# Patient Record
Sex: Female | Born: 1965 | Race: Black or African American | Hispanic: No | Marital: Single | Smoking: Never smoker
Health system: Southern US, Community
[De-identification: ages and names within clinical notes are randomized; demographics above are authoritative.]

## PROBLEM LIST (undated history)

## (undated) ENCOUNTER — Emergency Department (HOSPITAL_COMMUNITY): Admission: EM | Payer: Medicare HMO | Source: Home / Self Care

## (undated) DIAGNOSIS — L738 Other specified follicular disorders: Secondary | ICD-10-CM

## (undated) DIAGNOSIS — F419 Anxiety disorder, unspecified: Secondary | ICD-10-CM

## (undated) DIAGNOSIS — M069 Rheumatoid arthritis, unspecified: Secondary | ICD-10-CM

## (undated) DIAGNOSIS — I10 Essential (primary) hypertension: Secondary | ICD-10-CM

## (undated) HISTORY — PX: VAGINA RECONSTRUCTION SURGERY: SHX828

## (undated) HISTORY — PX: COLON SURGERY: SHX602

## (undated) HISTORY — PX: STOMACH SURGERY: SHX791

## (undated) HISTORY — PX: ABDOMINAL HYSTERECTOMY: SHX81

---

## 2008-06-23 ENCOUNTER — Emergency Department (HOSPITAL_BASED_OUTPATIENT_CLINIC_OR_DEPARTMENT_OTHER): Admission: EM | Admit: 2008-06-23 | Discharge: 2008-06-23 | Payer: Self-pay | Admitting: Emergency Medicine

## 2008-07-05 ENCOUNTER — Emergency Department (HOSPITAL_BASED_OUTPATIENT_CLINIC_OR_DEPARTMENT_OTHER): Admission: EM | Admit: 2008-07-05 | Discharge: 2008-07-05 | Payer: Self-pay | Admitting: Emergency Medicine

## 2008-07-21 ENCOUNTER — Emergency Department (HOSPITAL_BASED_OUTPATIENT_CLINIC_OR_DEPARTMENT_OTHER): Admission: EM | Admit: 2008-07-21 | Discharge: 2008-07-21 | Payer: Self-pay | Admitting: Emergency Medicine

## 2013-05-26 DIAGNOSIS — K219 Gastro-esophageal reflux disease without esophagitis: Secondary | ICD-10-CM | POA: Insufficient documentation

## 2014-02-01 DIAGNOSIS — M51379 Other intervertebral disc degeneration, lumbosacral region without mention of lumbar back pain or lower extremity pain: Secondary | ICD-10-CM | POA: Insufficient documentation

## 2014-02-01 DIAGNOSIS — M5137 Other intervertebral disc degeneration, lumbosacral region: Secondary | ICD-10-CM | POA: Insufficient documentation

## 2014-03-18 DIAGNOSIS — F3162 Bipolar disorder, current episode mixed, moderate: Secondary | ICD-10-CM | POA: Insufficient documentation

## 2014-03-18 DIAGNOSIS — F4541 Pain disorder exclusively related to psychological factors: Secondary | ICD-10-CM | POA: Insufficient documentation

## 2014-06-05 DIAGNOSIS — I10 Essential (primary) hypertension: Secondary | ICD-10-CM | POA: Insufficient documentation

## 2014-06-05 DIAGNOSIS — G4730 Sleep apnea, unspecified: Secondary | ICD-10-CM | POA: Insufficient documentation

## 2014-06-05 DIAGNOSIS — Z9889 Other specified postprocedural states: Secondary | ICD-10-CM | POA: Insufficient documentation

## 2014-06-05 DIAGNOSIS — Z6841 Body Mass Index (BMI) 40.0 and over, adult: Secondary | ICD-10-CM | POA: Insufficient documentation

## 2014-06-05 DIAGNOSIS — F419 Anxiety disorder, unspecified: Secondary | ICD-10-CM | POA: Insufficient documentation

## 2014-07-24 DIAGNOSIS — E1159 Type 2 diabetes mellitus with other circulatory complications: Secondary | ICD-10-CM

## 2014-07-24 DIAGNOSIS — E669 Obesity, unspecified: Secondary | ICD-10-CM | POA: Insufficient documentation

## 2014-07-24 DIAGNOSIS — E119 Type 2 diabetes mellitus without complications: Secondary | ICD-10-CM | POA: Insufficient documentation

## 2014-07-24 DIAGNOSIS — I10 Essential (primary) hypertension: Secondary | ICD-10-CM

## 2014-07-24 DIAGNOSIS — E1169 Type 2 diabetes mellitus with other specified complication: Secondary | ICD-10-CM

## 2014-07-26 DIAGNOSIS — E785 Hyperlipidemia, unspecified: Secondary | ICD-10-CM | POA: Insufficient documentation

## 2014-07-26 DIAGNOSIS — E161 Other hypoglycemia: Secondary | ICD-10-CM | POA: Insufficient documentation

## 2014-11-21 DIAGNOSIS — Z90710 Acquired absence of both cervix and uterus: Secondary | ICD-10-CM | POA: Insufficient documentation

## 2014-12-12 ENCOUNTER — Emergency Department (HOSPITAL_BASED_OUTPATIENT_CLINIC_OR_DEPARTMENT_OTHER): Payer: Medicare HMO

## 2014-12-12 ENCOUNTER — Encounter (HOSPITAL_BASED_OUTPATIENT_CLINIC_OR_DEPARTMENT_OTHER): Payer: Self-pay

## 2014-12-12 ENCOUNTER — Emergency Department (HOSPITAL_BASED_OUTPATIENT_CLINIC_OR_DEPARTMENT_OTHER)
Admission: EM | Admit: 2014-12-12 | Discharge: 2014-12-12 | Disposition: A | Discharge status: Home / Self Care | Payer: Medicare HMO | Attending: Emergency Medicine | Admitting: Emergency Medicine

## 2014-12-12 DIAGNOSIS — F419 Anxiety disorder, unspecified: Secondary | ICD-10-CM | POA: Diagnosis not present

## 2014-12-12 DIAGNOSIS — Z79899 Other long term (current) drug therapy: Secondary | ICD-10-CM | POA: Diagnosis not present

## 2014-12-12 DIAGNOSIS — M79671 Pain in right foot: Secondary | ICD-10-CM | POA: Diagnosis present

## 2014-12-12 DIAGNOSIS — M722 Plantar fascial fibromatosis: Secondary | ICD-10-CM

## 2014-12-12 DIAGNOSIS — I10 Essential (primary) hypertension: Secondary | ICD-10-CM | POA: Insufficient documentation

## 2014-12-12 DIAGNOSIS — R52 Pain, unspecified: Secondary | ICD-10-CM

## 2014-12-12 HISTORY — DX: Anxiety disorder, unspecified: F41.9

## 2014-12-12 HISTORY — DX: Essential (primary) hypertension: I10

## 2014-12-12 NOTE — ED Provider Notes (Signed)
CSN: [id number]     Arrival date & time 12/12/14  1417 History   First MD Initiated Contact with Patient 12/12/14 1526     Chief Complaint  Patient presents with  . Foot Pain     (Consider location/radiation/quality/duration/timing/severity/associated sxs/prior Treatment) HPI Comments: 48 year old morbidly obese female presenting with right foot pain 2 weeks. No known injury or trauma. She reports shooting pain across the bottom of her foot when she takes a step. Denies any swelling. No fevers or skin color change. She has tried Tylenol with minimal relief. Denies any history of gout. She has also complaining of mild left big toe pain. No injury or trauma. No swelling. No aggravating or alleviating factors.  Patient is a 48 y.o. female presenting with lower extremity pain. The history is provided by the patient.  Foot Pain    Past Medical History  Diagnosis Date  . Hypertension   . Anxiety    Past Surgical History  Procedure Laterality Date  . Abdominal hysterectomy    . Vagina reconstruction surgery     No family history on file. History  Substance Use Topics  . Smoking status: Never Smoker   . Smokeless tobacco: Not on file  . Alcohol Use: No   OB History    No data available     Review of Systems  10 Systems reviewed and are negative for acute change except as noted in the HPI.  Allergies  Review of patient's allergies indicates no known allergies.  Home Medications   Prior to Admission medications   Medication Sig Start Date End Date Taking? Authorizing Provider  ALPRAZolam (XANAX PO) Take by mouth.   Yes Historical Provider, MD  CARVEDILOL PO Take by mouth.   Yes Historical Provider, MD  VALSARTAN PO Take by mouth.   Yes Historical Provider, MD   BP 142/91 mmHg  Pulse 91  Temp(Src) 98.3 F (36.8 C) (Oral)  Resp 18  Ht 5\' 9"  (1.753 m)  Wt 393 lb (178.264 kg)  BMI 58.01 kg/m2  SpO2 100% Physical Exam  Constitutional: She is oriented to person,  place, and time. She appears well-developed and well-nourished. No distress.  Morbidly obese.  HENT:  Head: Normocephalic and atraumatic.  Mouth/Throat: Oropharynx is clear and moist.  Eyes: Conjunctivae and EOM are normal.  Neck: Normal range of motion. Neck supple.  Cardiovascular: Normal rate, regular rhythm and normal heart sounds.   Pulmonary/Chest: Effort normal and breath sounds normal. No respiratory distress.  Musculoskeletal: Normal range of motion. She exhibits no edema.  Right foot tender to palpation from base of calcaneus along plantar fascia. No tenderness to the dorsum of her foot. No swelling. Full range of motion. Left foot mild tenderness over first MTP. No swelling, erythema or warmth. No deformity.  Neurological: She is alert and oriented to person, place, and time. No sensory deficit.  Skin: Skin is warm and dry.  Psychiatric: She has a normal mood and affect. Her behavior is normal.  Nursing note and vitals reviewed.   ED Course  Procedures (including critical care time) Labs Review Labs Reviewed - No data to display  Imaging Review Dg Foot Complete Right  12/12/2014   CLINICAL DATA:  Right heel pain for 1 month and left first toe pain 2 weeks. No injury.  EXAM: RIGHT FOOT COMPLETE - 3+ VIEW  COMPARISON:  None.  FINDINGS: Examination demonstrates no evidence of acute fracture or dislocation. Likely chronic fragmentation along the lateral base of the first proximal phalanx.  Mild degenerative change over the midfoot. Small inferior calcaneal spur.  IMPRESSION: No acute findings.   Electronically Signed   By: Marin Olp M.D.   On: 12/12/2014 15:08   Dg Toe Great Left  12/12/2014   CLINICAL DATA:  RIGHT heel pain for 1 month, LEFT great toe pain for 2 weeks, no known injury  EXAM: LEFT GREAT TOE  COMPARISON:  None  FINDINGS: Osseous mineralization normal.  Joint spaces preserved.  No acute fracture, dislocation, or bone destruction.  IMPRESSION: No acute osseous  abnormalities.   Electronically Signed   By: Lavonia Dana M.D.   On: 12/12/2014 15:07     EKG Interpretation None      MDM   Final diagnoses:  Plantar fasciitis, right   Patient in no apparent distress. No swelling, bruising, erythema or warmth. Doubt cellulitis. Doubt gout. Tenderness along plantar fascia. X-rays obtained in triage prior to my evaluation. No acute findings. Discussed symptomatic treatment for plantar fasciitis and also went over certain exercises that she could do. Follow-up with PCP. Stable for discharge. Return precautions given. Patient states understanding of treatment care plan and is agreeable.  Carman Ching, PA-C 12/12/14 [place], MD 12/12/14 [phone]

## 2014-12-12 NOTE — ED Notes (Signed)
Right foot pain x 1 month-left great toe x 2 weeks-denies injury

## 2014-12-12 NOTE — Discharge Instructions (Signed)
Plantar Fasciitis  Plantar fasciitis is a common condition that causes foot pain. It is soreness (inflammation) of the band of tough fibrous tissue on the bottom of the foot that runs from the heel bone (calcaneus) to the ball of the foot. The cause of this soreness may be from excessive standing, poor fitting shoes, running on hard surfaces, being overweight, having an abnormal walk, or overuse (this is common in runners) of the painful foot or feet. It is also common in aerobic exercise dancers and ballet dancers.  SYMPTOMS   Most people with plantar fasciitis complain of:   Severe pain in the morning on the bottom of their foot especially when taking the first steps out of bed. This pain recedes after a few minutes of walking.   Severe pain is experienced also during walking following a long period of inactivity.   Pain is worse when walking barefoot or up stairs  DIAGNOSIS    Your caregiver will diagnose this condition by examining and feeling your foot.   Special tests such as X-rays of your foot, are usually not needed.  PREVENTION    Consult a sports medicine professional before beginning a new exercise program.   Walking programs offer a good workout. With walking there is a lower chance of overuse injuries common to runners. There is less impact and less jarring of the joints.   Begin all new exercise programs slowly. If problems or pain develop, decrease the amount of time or distance until you are at a comfortable level.   Wear good shoes and replace them regularly.   Stretch your foot and the heel cords at the back of the ankle (Achilles tendon) both before and after exercise.   Run or exercise on even surfaces that are not hard. For example, asphalt is better than pavement.   Do not run barefoot on hard surfaces.   If using a treadmill, vary the incline.   Do not continue to workout if you have foot or joint problems. Seek professional help if they do not improve.  HOME CARE INSTRUCTIONS     Avoid activities that cause you pain until you recover.   Use ice or cold packs on the problem or painful areas after working out.   Only take over-the-counter or prescription medicines for pain, discomfort, or fever as directed by your caregiver.   Soft shoe inserts or athletic shoes with air or gel sole cushions may be helpful.   If problems continue or become more severe, consult a sports medicine caregiver or your own health care provider. Cortisone is a potent anti-inflammatory medication that may be injected into the painful area. You can discuss this treatment with your caregiver.  MAKE SURE YOU:    Understand these instructions.   Will watch your condition.   Will get help right away if you are not doing well or get worse.  Document Released: 08/31/2001 Document Revised: 02/28/2012 Document Reviewed: 10/30/2008  Exit[employer] Patient Information 2015 [employer]. This information is not intended to replace advice given to you by your health care provider. Make sure you discuss any questions you have with your health care provider.

## 2014-12-18 ENCOUNTER — Ambulatory Visit: Payer: Self-pay | Admitting: Podiatry

## 2014-12-18 ENCOUNTER — Encounter: Payer: Self-pay | Admitting: Podiatry

## 2014-12-18 ENCOUNTER — Ambulatory Visit (INDEPENDENT_AMBULATORY_CARE_PROVIDER_SITE_OTHER): Payer: Commercial Managed Care - HMO | Admitting: Podiatry

## 2014-12-18 VITALS — BP 142/92 | HR 85 | Ht 69.0 in | Wt 380.0 lb

## 2014-12-18 DIAGNOSIS — M7740 Metatarsalgia, unspecified foot: Secondary | ICD-10-CM | POA: Diagnosis not present

## 2014-12-18 DIAGNOSIS — M21969 Unspecified acquired deformity of unspecified lower leg: Secondary | ICD-10-CM

## 2014-12-18 DIAGNOSIS — M216X9 Other acquired deformities of unspecified foot: Secondary | ICD-10-CM | POA: Diagnosis not present

## 2014-12-18 DIAGNOSIS — M7741 Metatarsalgia, right foot: Secondary | ICD-10-CM

## 2014-12-18 DIAGNOSIS — M7742 Metatarsalgia, left foot: Principal | ICD-10-CM

## 2014-12-18 MED ORDER — NABUMETONE 500 MG PO TABS: 500.0000 mg | ORAL_TABLET | Freq: Two times a day (BID) | ORAL | Status: DC

## 2014-12-18 MED ORDER — HYDROCODONE-IBUPROFEN 7.5-200 MG PO TABS: 1.0000 | ORAL_TABLET | Freq: Four times a day (QID) | ORAL | Status: DC | PRN

## 2014-12-18 NOTE — Progress Notes (Signed)
Subjective: Pain in right heel all around the foot, left the same with 5th toe stay numb. Sore even not walking. This has been going on for about 8-9 months. Prior to be out on disability she used to work on [employer]. 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   Review of Systems - General ROS: negative for - chills, fatigue, fever, hot flashes, night sweats or sleep disturbance Ophthalmic ROS: Cornea transplant 15 years ago. ENT ROS: negative Allergy and Immunology ROS: negative Hematological and Lymphatic ROS: negative Endocrine ROS: negative Breast ROS: negative for breast lumps Respiratory ROS: no cough, shortness of breath, or wheezing Cardiovascular ROS: no chest pain or dyspnea on exertion Gastrointestinal ROS: no abdominal pain, change in bowel habits, or black or bloody stools Genito-Urinary ROS: no dysuria, trouble voiding, or hematuria Musculoskeletal ROS: Back and foot pain. Neurological ROS: negative Dermatological ROS: negative.  Objective: Dermatologic: Normal findings and no abnormal skin lesions. Vascular: All pedal pulses are palpable. No edema or erythema noted. Neurologic: All epicritic and tactile sensations grossly intact.  Orthopedic: Tight Achilles tendon bilateral. Elevated first ray bilateral. Severe ankle and Subtalar joint pronation upon loading of forefoot bilateral. Lesser metatarso-phalangeal joint pain with prolonged weight bearing.  Plantar heel pain later part of the day.  Radiographic examination reveal severe elevation of the first ray on lateral view bilateral. Positive of abnormal periosteal bone reaction along the 2nd, 3rd, 4th metatrasal shaft bilateral.   Assessment: Ankle equinus bilateral. Hypermobile first ray bilateral. Ankle and STJ hyperpronation bilateral. Plantar fasciitis bilateral. Lesser metatarsalgia bilateral. Flexible flat foot.  Abnormal weight shifting to lateral column.   Plan: Reviewed findings and  available options; Weight control, exercise to stretch Achilles tendon, Custom or OTC Orthotics, injections, change in shoe gear, ankle brace, including surgical options.  Ankle brace dispensed for both feet with instruction. Pain medication prescribed. Return in 2 weeks.

## 2014-12-18 NOTE — Patient Instructions (Signed)
Seen for bilateral foot pain. Ankle brace dispensed. Pain medication prescribed. Do stretch exercise as instructed. Return in 2 weeks.

## 2014-12-27 ENCOUNTER — Telehealth: Payer: Self-pay | Admitting: *Deleted

## 2014-12-27 NOTE — Telephone Encounter (Signed)
12/27/2013 Patient called this afternoon and request stronger pain meds and muscle relaxer . Foot is hurting worse and having cramps. Please advise, Does she need to come in this afternoon.

## 2015-01-01 ENCOUNTER — Ambulatory Visit: Payer: Commercial Managed Care - HMO | Admitting: Podiatry

## 2015-06-05 ENCOUNTER — Ambulatory Visit (HOSPITAL_COMMUNITY): Payer: Commercial Managed Care - HMO | Admitting: Psychiatry

## 2015-12-17 ENCOUNTER — Emergency Department (HOSPITAL_BASED_OUTPATIENT_CLINIC_OR_DEPARTMENT_OTHER): Payer: Medicare Other

## 2015-12-17 ENCOUNTER — Emergency Department (HOSPITAL_BASED_OUTPATIENT_CLINIC_OR_DEPARTMENT_OTHER)
Admission: EM | Admit: 2015-12-17 | Discharge: 2015-12-17 | Disposition: A | Discharge status: Home / Self Care | Payer: Medicare Other | Attending: Emergency Medicine | Admitting: Emergency Medicine

## 2015-12-17 ENCOUNTER — Encounter (HOSPITAL_BASED_OUTPATIENT_CLINIC_OR_DEPARTMENT_OTHER): Payer: Self-pay | Admitting: *Deleted

## 2015-12-17 DIAGNOSIS — F419 Anxiety disorder, unspecified: Secondary | ICD-10-CM | POA: Diagnosis not present

## 2015-12-17 DIAGNOSIS — M1712 Unilateral primary osteoarthritis, left knee: Secondary | ICD-10-CM | POA: Diagnosis not present

## 2015-12-17 DIAGNOSIS — M25562 Pain in left knee: Secondary | ICD-10-CM | POA: Diagnosis present

## 2015-12-17 DIAGNOSIS — I10 Essential (primary) hypertension: Secondary | ICD-10-CM | POA: Diagnosis not present

## 2015-12-17 MED ORDER — IBUPROFEN 800 MG PO TABS
800.0000 mg | ORAL_TABLET | Freq: Once | ORAL | Status: AC
  Administered 2015-12-17: 800 mg via ORAL
  Filled 2015-12-17: qty 1

## 2015-12-17 MED ORDER — NAPROXEN 500 MG PO TABS: 500.0000 mg | ORAL_TABLET | Freq: Two times a day (BID) | ORAL | Status: DC

## 2015-12-17 NOTE — ED Provider Notes (Signed)
CSN: [id number]     Arrival date & time 12/17/15  [id number] History   First MD Initiated Contact with Patient 12/17/15 [phone]     No chief complaint on file.    (Consider location/radiation/quality/duration/timing/severity/associated sxs/prior Treatment) HPI  Pt presenting with c/o pain behind left knee.  She states pain began approx 5 days ago, she noted pain behind her knee with some swelling.  Pain has gotten worse and now it is difficult for her to bear weight.  No foot swelling.  No trauma or injury.  She states pain is worse when she flexes her foot- she feels a pulling in her calf and behind the knee.  No chest pain or shortness of breath.  She has not had any treatment prior to arrival.  There are no other associated systemic symptoms, there are no other alleviating or modifying factors.   Past Medical History  Diagnosis Date  . Hypertension   . Anxiety    Past Surgical History  Procedure Laterality Date  . Abdominal hysterectomy    . Vagina reconstruction surgery     No family history on file. Social History  Substance Use Topics  . Smoking status: Never Smoker   . Smokeless tobacco: Never Used  . Alcohol Use: No   OB History    No data available     Review of Systems  ROS reviewed and all otherwise negative except for mentioned in HPI    Allergies  Review of patient's allergies indicates no known allergies.  Home Medications   Prior to Admission medications   Medication Sig Start Date End Date Taking? Authorizing Provider  ALPRAZolam (XANAX PO) Take by mouth.   Yes Historical Provider, MD  CARVEDILOL PO Take 6.25 mg by mouth.    Yes Historical Provider, MD  VALSARTAN PO Take by mouth.   Yes Historical Provider, MD  naproxen (NAPROSYN) 500 MG tablet Take 1 tablet (500 mg total) by mouth 2 (two) times daily. 12/17/15   Alfonzo Beers, MD   BP 126/89 mmHg  Pulse 74  Temp(Src) 97.7 F (36.5 C) (Oral)  Resp 16  Ht 5\' 9"  (1.753 m)  Wt 166.47 kg  BMI 54.17 kg/m2   SpO2 100%  Vitals reviewed Physical Exam  Physical Examination: General appearance - alert, well appearing, and in no distress Mental status - alert, oriented to person, place, and time Eyes -no conjunctival injection no scleral icterus Mouth - mucous membranes moist, pharynx normal without lesions Chest - clear to auscultation, no wheezes, rales or rhonchi, symmetric air entry Heart - normal rate, regular rhythm, normal S1, S2, no murmurs, rubs, clicks or gallops Neurological - alert, oriented, normal speech Musculoskeletal - ttp over posterior of left knee, no palpable cords, negative anterior drawer sign, pain with dorsiflexion of foot,  otherwise no joint tenderness, deformity or swelling Extremities - peripheral pulses normal, no pedal edema, no clubbing or cyanosis Skin - normal coloration and turgor, no rashes  ED Course  Procedures (including critical care time) Labs Review Labs Reviewed - No data to display  Imaging Review US Venous Img Lower Unilateral Left  12/17/2015  CLINICAL DATA:  LEFT knee pain since 12/12/2015. EXAM: LEFT LOWER EXTREMITY VENOUS DOPPLER ULTRASOUND TECHNIQUE: Gray-scale sonography with graded compression, as well as color Doppler and duplex ultrasound were performed to evaluate the lower extremity deep venous systems from the level of the common femoral vein and including the common femoral, femoral, profunda femoral, popliteal and calf veins including the posterior tibial, peroneal and  gastrocnemius veins when visible. The superficial great saphenous vein was also interrogated. Spectral Doppler was utilized to evaluate flow at rest and with distal augmentation maneuvers in the common femoral, femoral and popliteal veins. COMPARISON:  None. FINDINGS: Contralateral Common Femoral Vein: Respiratory phasicity is normal and symmetric with the symptomatic side. No evidence of thrombus. Normal compressibility. Common Femoral Vein: No evidence of thrombus. Normal  compressibility, respiratory phasicity and response to augmentation. Saphenofemoral Junction: No evidence of thrombus. Normal compressibility and flow on color Doppler imaging. Profunda Femoral Vein: No evidence of thrombus. Normal compressibility and flow on color Doppler imaging. Femoral Vein: No evidence of thrombus. Normal compressibility, respiratory phasicity and response to augmentation. Popliteal Vein: No evidence of thrombus. Normal compressibility, respiratory phasicity and response to augmentation. Calf Veins: No evidence of thrombus. Normal compressibility and flow on color Doppler imaging. Superficial Great Saphenous Vein: No evidence of thrombus. Normal compressibility and flow on color Doppler imaging. Venous Reflux:  None. Other Findings:  None. IMPRESSION: No evidence of deep venous thrombosis. Electronically Signed   By: Staci Righter M.D.   On: 12/17/2015 09:56   Dg Knee Complete 4 Views Left  12/17/2015  CLINICAL DATA:  Pain for 5 days.  No history of trauma EXAM: LEFT KNEE - COMPLETE 4+ VIEW COMPARISON:  None. FINDINGS: Frontal, lateral, and bilateral oblique views were obtained. There is soft tissue swelling. No appreciable joint effusion. No fracture or dislocation. There is narrowing of the patellofemoral joint. Other bone spaces appear normal. There is spurring in all compartments, most pronounced along the superior posterior patella. No erosive change. IMPRESSION: Osteoarthritic change, primarily in the patellofemoral joint. There is soft tissue swelling with no appreciable joint effusion. No fracture. Electronically Signed   By: Lowella Grip III M.D.   On: 12/17/2015 10:17   I have personally reviewed and evaluated these images and lab results as part of my medical decision-making.   EKG Interpretation None      MDM   Final diagnoses:  Primary osteoarthritis of left knee    Pt presenting with c/o left knee pain, pain is behind left knee and worse with dorsiflexion  of foot.  DVT study negative for DVT.  Xray of knee shows osteoarthritis- pt placed in knee immobilzer, advised f/u with orthopedics.  Pt given rx for naproxen for symptoms.  Discharged with strict return precautions.  Pt agreeable with plan.    Alfonzo Beers, MD 12/18/15 [phone]

## 2015-12-17 NOTE — ED Notes (Addendum)
C/o pain behind left knee started 12/23. C/o swelling to left knee. 2+ pedal pulses. No injury.

## 2015-12-17 NOTE — Discharge Instructions (Signed)
Return to the ED with any concerns including increased pain or swelling, fever/chills, decreased level of alertness/lethargy, or any other alarming symptoms

## 2015-12-17 NOTE — ED Notes (Signed)
Patient preparing for discharge. 

## 2016-01-21 ENCOUNTER — Encounter: Payer: Self-pay | Admitting: Family Medicine

## 2016-01-21 ENCOUNTER — Ambulatory Visit (INDEPENDENT_AMBULATORY_CARE_PROVIDER_SITE_OTHER): Payer: Medicare Other | Admitting: Family Medicine

## 2016-01-21 VITALS — BP 133/83 | HR 88 | Ht 70.0 in | Wt 367.0 lb

## 2016-01-21 DIAGNOSIS — M25562 Pain in left knee: Secondary | ICD-10-CM

## 2016-01-21 MED ORDER — TIZANIDINE HCL 4 MG PO TABS: 4.0000 mg | ORAL_TABLET | Freq: Four times a day (QID) | ORAL | Status: DC | PRN

## 2016-01-21 MED ORDER — METHYLPREDNISOLONE ACETATE 40 MG/ML IJ SUSP
40.0000 mg | Freq: Once | INTRAMUSCULAR | Status: AC
  Administered 2016-01-21: 40 mg via INTRA_ARTICULAR

## 2016-01-21 MED ORDER — DICLOFENAC SODIUM 1 % TD GEL: 4.0000 g | Freq: Four times a day (QID) | TRANSDERMAL | Status: DC

## 2016-01-21 NOTE — Patient Instructions (Signed)
Your knee pain is due to arthritis Take tylenol 500mg  1-2 tabs three times a day for pain. Voltaren gel up to 4 times a day for pain and inflammation. Glucosamine sulfate 750mg  twice a day is a supplement that may help. Other topicals in addition to the voltaren can help: Capsaicin, aspercreme, or biofreeze. Cortisone injections are an option - you were given this today. If cortisone injections do not help, there are different types of shots that may help but they take longer to take effect. It's important that you continue to stay active. Straight leg raises, knee extensions 3 sets of 10 once a day (add ankle weight if these become too easy). Consider physical therapy to strengthen muscles around the joint that hurts to take pressure off of the joint itself. Shoe inserts with good arch support may be helpful. Walker or cane if needed. Heat or ice 15 minutes at a time 3-4 times a day as needed to help with pain. Water aerobics and cycling with low resistance are the best two types of exercise for arthritis. Follow up with me in 1 month for reevaluation.

## 2016-01-26 DIAGNOSIS — M25562 Pain in left knee: Secondary | ICD-10-CM | POA: Insufficient documentation

## 2016-01-26 NOTE — Assessment & Plan Note (Signed)
Independently reviewed radiographs - consistent with DJD.  Given intraarticular injection today.  Tylenol, voltaren gel.  Discussed topical medications, glucosamine.  Shown home exercises to do daily.  F/u in 1 month for reevaluation.  After informed written consent, patient was seated on exam table. Left knee was prepped with alcohol swab and utilizing superolateral approach with ultrasound guidance, patient's left knee was injected intraarticularly with 3:1 marcaine: depomedrol. Patient tolerated the procedure well without immediate complications.

## 2016-01-26 NOTE — Progress Notes (Signed)
PCP: Benito Mccreedy, MD  Subjective:   HPI: Patient is a 50 y.o. female here for left knee pain.  Patient reports for 4 months she's had posterior to anterior left knee pain. No known injury or trauma. Worse with bearing weight, trying to straighten her knee. Pain level 9/10, sharp. Some swelling and catching. Had doppler negative for DVT. No skin changes, fever, other complaints.  Past Medical History  Diagnosis Date  . Hypertension   . Anxiety     Current Outpatient Prescriptions on File Prior to Visit  Medication Sig Dispense Refill  . naproxen (NAPROSYN) 500 MG tablet Take 1 tablet (500 mg total) by mouth 2 (two) times daily. 30 tablet 0   No current facility-administered medications on file prior to visit.    Past Surgical History  Procedure Laterality Date  . Abdominal hysterectomy    . Vagina reconstruction surgery      Allergies  Allergen Reactions  . Amlodipine     Other reaction(s): SWELLING  . Gabapentin     Other reaction(s): NAUSEA  . Pramipexole     Other reaction(s): NAUSEA  . Pregabalin     Other reaction(s): VOMITING  . Ropinirole     Other reaction(s): VOMITING    Social History   Social History  . Marital Status: Single    Spouse Name: N/A  . Number of Children: N/A  . Years of Education: N/A   Occupational History  . Not on file.   Social History Main Topics  . Smoking status: Never Smoker   . Smokeless tobacco: Never Used  . Alcohol Use: No  . Drug Use: No  . Sexual Activity: Not on file   Other Topics Concern  . Not on file   Social History Narrative    No family history on file.  BP 133/83 mmHg  Pulse 88  Ht 5\' 10"  (1.778 m)  Wt 367 lb (166.47 kg)  BMI 52.66 kg/m2  Review of Systems: See HPI above.    Objective:  Physical Exam:  Gen: NAD, comfortable in exam room.  Left knee: No gross deformity, ecchymoses.  Mild effusion. TTP medial and lateral joint lines, post patellar facets.  Mild popliteal  tenderness.  No other tenderness. FROM. Negative ant/post drawers. Negative valgus/varus testing. Negative lachmanns. Negative mcmurrays, apleys, patellar apprehension. NV intact distally.  Right knee: FROM without pain.    Assessment & Plan:  1. Left knee pain - Independently reviewed radiographs - consistent with DJD.  Given intraarticular injection today.  Tylenol, voltaren gel.  Discussed topical medications, glucosamine.  Shown home exercises to do daily.  F/u in 1 month for reevaluation.  After informed written consent, patient was seated on exam table. Left knee was prepped with alcohol swab and utilizing superolateral approach with ultrasound guidance, patient's left knee was injected intraarticularly with 3:1 marcaine: depomedrol. Patient tolerated the procedure well without immediate complications.

## 2016-02-18 ENCOUNTER — Ambulatory Visit: Payer: Medicare Other | Admitting: Family Medicine

## 2016-03-01 ENCOUNTER — Telehealth: Payer: Self-pay | Admitting: Family Medicine

## 2016-03-08 ENCOUNTER — Other Ambulatory Visit: Payer: Self-pay | Admitting: Family Medicine

## 2016-03-08 NOTE — Telephone Encounter (Signed)
Scheduled to come in 3/24 to start these.

## 2016-03-12 ENCOUNTER — Ambulatory Visit (INDEPENDENT_AMBULATORY_CARE_PROVIDER_SITE_OTHER): Payer: Medicare Other | Admitting: Family Medicine

## 2016-03-12 ENCOUNTER — Encounter: Payer: Self-pay | Admitting: Family Medicine

## 2016-03-12 VITALS — BP 107/79 | HR 75 | Ht 69.0 in | Wt 340.0 lb

## 2016-03-12 DIAGNOSIS — M25562 Pain in left knee: Secondary | ICD-10-CM

## 2016-03-12 DIAGNOSIS — M1712 Unilateral primary osteoarthritis, left knee: Secondary | ICD-10-CM

## 2016-03-12 DIAGNOSIS — M179 Osteoarthritis of knee, unspecified: Secondary | ICD-10-CM | POA: Insufficient documentation

## 2016-03-12 DIAGNOSIS — M1710 Unilateral primary osteoarthritis, unspecified knee: Secondary | ICD-10-CM | POA: Insufficient documentation

## 2016-03-12 DIAGNOSIS — M129 Arthropathy, unspecified: Secondary | ICD-10-CM | POA: Diagnosis not present

## 2016-03-12 MED ORDER — SODIUM HYALURONATE (VISCOSUP) 25 MG/2.5ML IX SOSY
2.5000 mL | PREFILLED_SYRINGE | Freq: Once | INTRA_ARTICULAR | Status: AC
  Administered 2016-03-12: 2.5 mL via INTRA_ARTICULAR

## 2016-03-12 NOTE — Assessment & Plan Note (Signed)
Independently reviewed radiographs - consistent with DJD.  Not improving with cortisone injection - starting supartz series today.  F/u in 1 week for second injection.  After informed written consent, patient was seated on exam table. Left knee was prepped with alcohol swab and utilizing superolateral approach with ultrasound guidance, patient's left knee was injected intraarticularly with 63mL marcaine followed by Jacklyn Shell. Patient tolerated the procedure well without immediate complications.

## 2016-03-12 NOTE — Progress Notes (Signed)
PCP: Benito Mccreedy, MD  Subjective:   HPI: Patient is a 50 y.o. female here for left knee pain.  2/1: Patient reports for 4 months she's had posterior to anterior left knee pain. No known injury or trauma. Worse with bearing weight, trying to straighten her knee. Pain level 9/10, sharp. Some swelling and catching. Had doppler negative for DVT. No skin changes, fever, other complaints.  3/24: Patient returns to start supartz series.  Past Medical History  Diagnosis Date  . Hypertension   . Anxiety     Current Outpatient Prescriptions on File Prior to Visit  Medication Sig Dispense Refill  . alprazolam (XANAX) 2 MG tablet TK 1 T PO  QID  1  . carvedilol (COREG) 6.25 MG tablet TK 1 T PO  BID  3  . DEXILANT 60 MG capsule TK ONE C PO BID  11  . diclofenac sodium (VOLTAREN) 1 % GEL Apply 4 g topically 4 (four) times daily. 5 Tube 1  . naproxen (NAPROSYN) 500 MG tablet Take 1 tablet (500 mg total) by mouth 2 (two) times daily. 30 tablet 0  . tiZANidine (ZANAFLEX) 4 MG tablet Take 1 tablet (4 mg total) by mouth every 6 (six) hours as needed for muscle spasms. 60 tablet 1  . valsartan-hydrochlorothiazide (DIOVAN-HCT) 320-25 MG tablet     . zolpidem (AMBIEN) 10 MG tablet   1   No current facility-administered medications on file prior to visit.    Past Surgical History  Procedure Laterality Date  . Abdominal hysterectomy    . Vagina reconstruction surgery      Allergies  Allergen Reactions  . Hydrocodone Other (See Comments)    Severe headache  . Amlodipine     Other reaction(s): SWELLING  . Gabapentin     Other reaction(s): NAUSEA  . Pramipexole     Other reaction(s): NAUSEA  . Pregabalin     Other reaction(s): VOMITING  . Ropinirole     Other reaction(s): VOMITING    Social History   Social History  . Marital Status: Single    Spouse Name: N/A  . Number of Children: N/A  . Years of Education: N/A   Occupational History  . Not on file.   Social  History Main Topics  . Smoking status: Never Smoker   . Smokeless tobacco: Never Used  . Alcohol Use: No  . Drug Use: No  . Sexual Activity: Not on file   Other Topics Concern  . Not on file   Social History Narrative    No family history on file.  BP 107/79 mmHg  Pulse 75  Ht 5\' 9"  (1.753 m)  Wt 340 lb (154.223 kg)  BMI 50.19 kg/m2  Review of Systems: See HPI above.    Objective:  Physical Exam:  Gen: NAD, comfortable in exam room.  Exam not repeated today. Left knee: No gross deformity, ecchymoses.  Mild effusion. TTP medial and lateral joint lines, post patellar facets.  Mild popliteal tenderness.  No other tenderness. FROM. Negative ant/post drawers. Negative valgus/varus testing. Negative lachmanns. Negative mcmurrays, apleys, patellar apprehension. NV intact distally.  Right knee: FROM without pain.    Assessment & Plan:  1. Left knee pain - Independently reviewed radiographs - consistent with DJD.  Not improving with cortisone injection - starting supartz series today.  F/u in 1 week for second injection.  After informed written consent, patient was seated on exam table. Left knee was prepped with alcohol swab and utilizing superolateral approach with ultrasound  guidance, patient's left knee was injected intraarticularly with 29mL marcaine followed by supartz. Patient tolerated the procedure well without immediate complications.

## 2016-03-19 ENCOUNTER — Encounter: Payer: Self-pay | Admitting: Family Medicine

## 2016-03-19 ENCOUNTER — Ambulatory Visit (INDEPENDENT_AMBULATORY_CARE_PROVIDER_SITE_OTHER): Payer: Medicare Other | Admitting: Family Medicine

## 2016-03-19 VITALS — BP 126/85 | HR 72 | Ht 69.0 in | Wt 340.0 lb

## 2016-03-19 DIAGNOSIS — M1712 Unilateral primary osteoarthritis, left knee: Secondary | ICD-10-CM

## 2016-03-19 DIAGNOSIS — M129 Arthropathy, unspecified: Secondary | ICD-10-CM | POA: Diagnosis present

## 2016-03-19 MED ORDER — SODIUM HYALURONATE (VISCOSUP) 25 MG/2.5ML IX SOSY
2.5000 mL | PREFILLED_SYRINGE | Freq: Once | INTRA_ARTICULAR | Status: AC
  Administered 2016-03-19: 2.5 mL via INTRA_ARTICULAR

## 2016-03-19 NOTE — Addendum Note (Signed)
Addended by: Sherrie George F on: 03/19/2016 12:30 PM   Modules accepted: Orders

## 2016-03-19 NOTE — Assessment & Plan Note (Signed)
Independently reviewed radiographs - consistent with DJD.  Not improving with cortisone injection - second supartz injection given today.  F/u in 1 week for third injection.  After informed written consent, patient was seated on exam table. Left knee was prepped with alcohol swab and utilizing superolateral approach with ultrasound guidance, patient's left knee was injected intraarticularly with 30mL marcaine followed by Jacklyn Shell. Patient tolerated the procedure well without immediate complications.

## 2016-03-19 NOTE — Progress Notes (Signed)
PCP: Benito Mccreedy, MD  Subjective:   HPI: Patient is a 50 y.o. female here for left knee pain.  2/1: Patient reports for 4 months she's had posterior to anterior left knee pain. No known injury or trauma. Worse with bearing weight, trying to straighten her knee. Pain level 9/10, sharp. Some swelling and catching. Had doppler negative for DVT. No skin changes, fever, other complaints.  3/24: Patient returns to start supartz series.  3/31: Patient returns for second supartz injection.  Past Medical History  Diagnosis Date  . Hypertension   . Anxiety     Current Outpatient Prescriptions on File Prior to Visit  Medication Sig Dispense Refill  . alprazolam (XANAX) 2 MG tablet TK 1 T PO  QID  1  . carvedilol (COREG) 6.25 MG tablet TK 1 T PO  BID  3  . DEXILANT 60 MG capsule TK ONE C PO BID  11  . diclofenac sodium (VOLTAREN) 1 % GEL Apply 4 g topically 4 (four) times daily. 5 Tube 1  . naproxen (NAPROSYN) 500 MG tablet Take 1 tablet (500 mg total) by mouth 2 (two) times daily. 30 tablet 0  . tiZANidine (ZANAFLEX) 4 MG tablet Take 1 tablet (4 mg total) by mouth every 6 (six) hours as needed for muscle spasms. 60 tablet 1  . zolpidem (AMBIEN) 10 MG tablet   1   No current facility-administered medications on file prior to visit.    Past Surgical History  Procedure Laterality Date  . Abdominal hysterectomy    . Vagina reconstruction surgery      Allergies  Allergen Reactions  . Hydrocodone Other (See Comments)    Severe headache  . Amlodipine     Other reaction(s): SWELLING  . Gabapentin     Other reaction(s): NAUSEA  . Pramipexole     Other reaction(s): NAUSEA  . Pregabalin     Other reaction(s): VOMITING  . Ropinirole     Other reaction(s): VOMITING    Social History   Social History  . Marital Status: Single    Spouse Name: N/A  . Number of Children: N/A  . Years of Education: N/A   Occupational History  . Not on file.   Social History Main  Topics  . Smoking status: Never Smoker   . Smokeless tobacco: Never Used  . Alcohol Use: No  . Drug Use: No  . Sexual Activity: Not on file   Other Topics Concern  . Not on file   Social History Narrative    No family history on file.  BP 126/85 mmHg  Pulse 72  Ht 5\' 9"  (1.753 m)  Wt 340 lb (154.223 kg)  BMI 50.19 kg/m2  Review of Systems: See HPI above.    Objective:  Physical Exam:  Gen: NAD, comfortable in exam room.  Exam not repeated today. Left knee: No gross deformity, ecchymoses.  Mild effusion. TTP medial and lateral joint lines, post patellar facets.  Mild popliteal tenderness.  No other tenderness. FROM. Negative ant/post drawers. Negative valgus/varus testing. Negative lachmanns. Negative mcmurrays, apleys, patellar apprehension. NV intact distally.  Right knee: FROM without pain.    Assessment & Plan:  1. Left knee pain - Independently reviewed radiographs - consistent with DJD.  Not improving with cortisone injection - second supartz injection given today.  F/u in 1 week for third injection.  After informed written consent, patient was seated on exam table. Left knee was prepped with alcohol swab and utilizing superolateral approach with ultrasound guidance,  patient's left knee was injected intraarticularly with 27mL marcaine followed by supartz. Patient tolerated the procedure well without immediate complications.

## 2016-03-26 ENCOUNTER — Ambulatory Visit (INDEPENDENT_AMBULATORY_CARE_PROVIDER_SITE_OTHER): Payer: Medicare Other | Admitting: Family Medicine

## 2016-03-26 ENCOUNTER — Encounter: Payer: Self-pay | Admitting: Family Medicine

## 2016-03-26 VITALS — BP 120/83 | HR 63 | Ht 69.0 in

## 2016-03-26 DIAGNOSIS — M1712 Unilateral primary osteoarthritis, left knee: Secondary | ICD-10-CM

## 2016-03-26 MED ORDER — SODIUM HYALURONATE (VISCOSUP) 25 MG/2.5ML IX SOSY
2.5000 mL | PREFILLED_SYRINGE | Freq: Once | INTRA_ARTICULAR | Status: AC
  Administered 2016-03-26: 2.5 mL via INTRA_ARTICULAR

## 2016-03-29 NOTE — Progress Notes (Signed)
PCP: Benito Mccreedy, MD  Subjective:   HPI: Patient is a 50 y.o. female here for left knee pain.  2/1: Patient reports for 4 months she's had posterior to anterior left knee pain. No known injury or trauma. Worse with bearing weight, trying to straighten her knee. Pain level 9/10, sharp. Some swelling and catching. Had doppler negative for DVT. No skin changes, fever, other complaints.  3/24: Patient returns to start supartz series.  3/31: Patient returns for second supartz injection.  4/7: Patient returns for third supartz injection.  Past Medical History  Diagnosis Date  . Hypertension   . Anxiety     Current Outpatient Prescriptions on File Prior to Visit  Medication Sig Dispense Refill  . alprazolam (XANAX) 2 MG tablet TK 1 T PO  QID  1  . carvedilol (COREG) 6.25 MG tablet TK 1 T PO  BID  3  . DEXILANT 60 MG capsule TK ONE C PO BID  11  . diclofenac sodium (VOLTAREN) 1 % GEL Apply 4 g topically 4 (four) times daily. 5 Tube 1  . losartan (COZAAR) 100 MG tablet     . naproxen (NAPROSYN) 500 MG tablet Take 1 tablet (500 mg total) by mouth 2 (two) times daily. 30 tablet 0  . tiZANidine (ZANAFLEX) 4 MG tablet Take 1 tablet (4 mg total) by mouth every 6 (six) hours as needed for muscle spasms. 60 tablet 1  . zolpidem (AMBIEN) 10 MG tablet   1   No current facility-administered medications on file prior to visit.    Past Surgical History  Procedure Laterality Date  . Abdominal hysterectomy    . Vagina reconstruction surgery      Allergies  Allergen Reactions  . Hydrocodone Other (See Comments)    Severe headache  . Amlodipine     Other reaction(s): SWELLING  . Gabapentin     Other reaction(s): NAUSEA  . Pramipexole     Other reaction(s): NAUSEA  . Pregabalin     Other reaction(s): VOMITING  . Ropinirole     Other reaction(s): VOMITING    Social History   Social History  . Marital Status: Single    Spouse Name: N/A  . Number of Children: N/A  .  Years of Education: N/A   Occupational History  . Not on file.   Social History Main Topics  . Smoking status: Never Smoker   . Smokeless tobacco: Never Used  . Alcohol Use: No  . Drug Use: No  . Sexual Activity: Not on file   Other Topics Concern  . Not on file   Social History Narrative    No family history on file.  BP 120/83 mmHg  Pulse 63  Ht 5\' 9"  (1.753 m)  Review of Systems: See HPI above.    Objective:  Physical Exam:  Gen: NAD, comfortable in exam room.  Exam not repeated today. Left knee: No gross deformity, ecchymoses.  Mild effusion. TTP medial and lateral joint lines, post patellar facets.  Mild popliteal tenderness.  No other tenderness. FROM. Negative ant/post drawers. Negative valgus/varus testing. Negative lachmanns. Negative mcmurrays, apleys, patellar apprehension. NV intact distally.  Right knee: FROM without pain.    Assessment & Plan:  1. Left knee pain - Independently reviewed radiographs - consistent with DJD.  Not improving with cortisone injection - third supartz injection given today.  Call us in 4 weeks for an update on her status.  After informed written consent, patient was lying supine on exam table. Left  knee was prepped with alcohol swab and utilizing superolateral approach with ultrasound guidance, patient's left knee was injected intraarticularly with 33mL marcaine followed by Jacklyn Shell. Patient tolerated the procedure well without immediate complications.

## 2016-03-29 NOTE — Assessment & Plan Note (Signed)
Independently reviewed radiographs - consistent with DJD.  Not improving with cortisone injection - third supartz injection given today.  Call us in 4 weeks for an update on her status.  After informed written consent, patient was lying supine on exam table. Left knee was prepped with alcohol swab and utilizing superolateral approach with ultrasound guidance, patient's left knee was injected intraarticularly with 11mL marcaine followed by Jacklyn Shell. Patient tolerated the procedure well without immediate complications.

## 2016-03-31 ENCOUNTER — Other Ambulatory Visit: Payer: Self-pay | Admitting: Family Medicine

## 2016-03-31 ENCOUNTER — Other Ambulatory Visit: Payer: Self-pay | Admitting: *Deleted

## 2016-03-31 MED ORDER — TIZANIDINE HCL 4 MG PO TABS: 4.0000 mg | ORAL_TABLET | Freq: Four times a day (QID) | ORAL | Status: AC | PRN

## 2016-04-27 ENCOUNTER — Other Ambulatory Visit: Payer: Self-pay | Admitting: Family Medicine

## 2016-05-03 ENCOUNTER — Other Ambulatory Visit: Payer: Self-pay | Admitting: Family Medicine

## 2016-05-04 ENCOUNTER — Telehealth: Payer: Self-pay | Admitting: Family Medicine

## 2016-05-07 NOTE — Telephone Encounter (Signed)
Spoke to patient and told her that the medication in question we do not prescribe long term. Patient made appointment.

## 2016-05-10 ENCOUNTER — Ambulatory Visit: Payer: Medicare Other | Admitting: Family Medicine

## 2017-03-29 ENCOUNTER — Encounter (HOSPITAL_BASED_OUTPATIENT_CLINIC_OR_DEPARTMENT_OTHER): Payer: Self-pay | Admitting: *Deleted

## 2017-03-29 ENCOUNTER — Emergency Department (HOSPITAL_BASED_OUTPATIENT_CLINIC_OR_DEPARTMENT_OTHER)
Admission: EM | Admit: 2017-03-29 | Discharge: 2017-03-29 | Disposition: A | Discharge status: Home / Self Care | Payer: Medicare Other | Attending: Emergency Medicine | Admitting: Emergency Medicine

## 2017-03-29 DIAGNOSIS — R531 Weakness: Secondary | ICD-10-CM | POA: Diagnosis not present

## 2017-03-29 DIAGNOSIS — Z79899 Other long term (current) drug therapy: Secondary | ICD-10-CM | POA: Insufficient documentation

## 2017-03-29 DIAGNOSIS — R51 Headache: Secondary | ICD-10-CM

## 2017-03-29 DIAGNOSIS — I10 Essential (primary) hypertension: Secondary | ICD-10-CM | POA: Diagnosis not present

## 2017-03-29 DIAGNOSIS — R519 Headache, unspecified: Secondary | ICD-10-CM

## 2017-03-29 DIAGNOSIS — L03211 Cellulitis of face: Secondary | ICD-10-CM | POA: Diagnosis not present

## 2017-03-29 MED ORDER — FLUCONAZOLE 150 MG PO TABS: 150.0000 mg | ORAL_TABLET | Freq: Every day | ORAL | 0 refills | Status: DC

## 2017-03-29 MED ORDER — AMOXICILLIN 500 MG PO CAPS: 500.0000 mg | ORAL_CAPSULE | Freq: Three times a day (TID) | ORAL | 0 refills | Status: DC

## 2017-03-29 MED FILL — AMOXICILLIN 500 MG CAPSULE: 500 | 7 days supply | Qty: 21 | Fill #0

## 2017-03-29 MED FILL — FLUCONAZOLE 150 MG TABLET: 150 | 2 days supply | Qty: 2 | Fill #0

## 2017-03-29 NOTE — Discharge Instructions (Signed)
Amoxicillin as prescribed.  Return to the emergency department if you develop worsening swelling, worsening pain, high fevers, severe headache, or other new and concerning symptoms.

## 2017-03-29 NOTE — ED Provider Notes (Signed)
[facility] Provider Note   CSN: [id number] Arrival date & time: 03/29/17  1127     History   Chief Complaint Chief Complaint  Patient presents with  . Weakness    HPI Kelly Valenzuela is a 51 y.o. female.  Patient is a 51 year old female with history of hypertension and obesity. She presents for evaluation of facial pain and swelling for the past 2 days. She denies any injury or trauma. She denies any fevers or chills. She has had swelling and tenderness to her right cheek, however no weakness, numbness, or tingling. She denies any weakness or numbness to her extremities.   The history is provided by the patient.  Weakness  Primary symptoms include no focal weakness, no loss of sensation. This is a new problem. The current episode started yesterday. The problem has not changed since onset.There has been no fever. Pertinent negatives include no chest pain, no altered mental status, no confusion and no headaches.    Past Medical History:  Diagnosis Date  . Anxiety   . Hypertension     Patient Active Problem List   Diagnosis Date Noted  . Arthritis of knee, left 03/12/2016  . Left knee pain 01/26/2016  . Metatarsal deformity 12/18/2014  . Equinus deformity of foot, acquired 12/18/2014  . Pronation deformity of ankle, acquired 12/18/2014  . Metatarsalgia of both feet 12/18/2014  . H/O: hysterectomy 11/21/2014  . HLD (hyperlipidemia) 07/26/2014  . Hyperinsulinemia 07/26/2014  . Obesity, diabetes, and hypertension syndrome ([place]) 07/24/2014  . Body mass index (BMI) of 50-59.9 in adult ([place]) 06/05/2014  . Anxiety 06/05/2014  . BP (high blood pressure) 06/05/2014  . Apnea, sleep 06/05/2014  . History of surgical procedure 06/05/2014  . Bipolar disorder, current episode mixed, moderate ([place]) 03/18/2014  . 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 Degeneration of intervertebral disc of lumbosacral region 02/01/2014  . Acid reflux 05/26/2013    Past Surgical History:    Procedure Laterality Date  . ABDOMINAL HYSTERECTOMY    . VAGINA RECONSTRUCTION SURGERY      OB History    No data available       Home Medications    Prior to Admission medications   Medication Sig Start Date End Date Taking? Authorizing Provider  alprazolam Duanne Moron) 2 MG tablet TK 1 T PO  QID 01/02/16  Yes Historical Provider, MD  carvedilol (COREG) 6.25 MG tablet TK 1 T PO  BID 01/18/16  Yes Historical Provider, MD  losartan (COZAAR) 100 MG tablet  03/15/16  Yes Historical Provider, MD  OMEPRAZOLE PO Take by mouth.   Yes Historical Provider, MD  Suvorexant (BELSOMRA) 20 MG TABS Take by mouth.   Yes Historical Provider, MD  DEXILANT 60 MG capsule TK ONE C PO BID 01/10/16   Historical Provider, MD  diclofenac sodium (VOLTAREN) 1 % GEL Apply 4 g topically 4 (four) times daily. 01/21/16   Dene Gentry, MD  naproxen (NAPROSYN) 500 MG tablet Take 1 tablet (500 mg total) by mouth 2 (two) times daily. 12/17/15   Alfonzo Beers, MD  tiZANidine (ZANAFLEX) 4 MG tablet Take 1 tablet (4 mg total) by mouth every 6 (six) hours as needed for muscle spasms. 03/31/16   Dene Gentry, MD  zolpidem (AMBIEN) 10 MG tablet  01/14/16   Historical Provider, MD    Family History No family history on file.  Social History Social History  Substance Use Topics  . Smoking status: Never Smoker  . Smokeless tobacco: Never Used  .  Alcohol use No     Allergies   Hydrocodone; Amlodipine; Gabapentin; Pramipexole; Pregabalin; and Ropinirole   Review of Systems Review of Systems  Cardiovascular: Negative for chest pain.  Neurological: Positive for weakness. Negative for focal weakness and headaches.  Psychiatric/Behavioral: Negative for confusion.  All other systems reviewed and are negative.    Physical Exam Updated Vital Signs BP (!) 150/109   Pulse 84   Temp 98.3 F (36.8 C) (Oral)   Resp 20   Ht 5\' 10"  (1.778 m)   Wt (!) 350 lb (158.8 kg)   SpO2 100%   BMI 50.22 kg/m   Physical Exam   Constitutional: She is oriented to person, place, and time. She appears well-developed and well-nourished. No distress.  HENT:  Head: Normocephalic and atraumatic.  Mouth/Throat: Oropharynx is clear and moist.  There is mild swelling and tenderness to the right cheek and right upper lip. There is no obvious dental abscess or abnormality.  Eyes: EOM are normal. Pupils are equal, round, and reactive to light.  Patient is able to fully open and close her eyelids. There is no weakness of the eyelid.  Neck: Normal range of motion. Neck supple.  Cardiovascular: Normal rate and regular rhythm.  Exam reveals no gallop and no friction rub.   No murmur heard. Pulmonary/Chest: Effort normal and breath sounds normal. No respiratory distress. She has no wheezes.  Abdominal: Soft. Bowel sounds are normal. She exhibits no distension. There is no tenderness.  Musculoskeletal: Normal range of motion.  Neurological: She is alert and oriented to person, place, and time. No cranial nerve deficit. She exhibits normal muscle tone. Coordination normal.  Skin: Skin is warm and dry. She is not diaphoretic.  Nursing note and vitals reviewed.    ED Treatments / Results  Labs (all labs ordered are listed, but only abnormal results are displayed) Labs Reviewed - No data to display  EKG  EKG Interpretation None       Radiology No results found.  Procedures Procedures (including critical care time)  Medications Ordered in ED Medications - No data to display   Initial Impression / Assessment and Plan / ED Course  I have reviewed the triage vital signs and the nursing notes.  Pertinent labs & imaging results that were available during my care of the patient were reviewed by me and considered in my medical decision making (see chart for details).  Patient presents with swelling and pain to her right cheek. She expressed a concern about the possibility of Bell's palsy, however this does not appear to be  the case. She is neurologically intact and there is no weakness of the facial muscles. This appears to be a soft tissue inflammation, possibly related to infection with a possible oral source. She will be treated with amoxicillin and is to follow-up as needed.  Final Clinical Impressions(s) / ED Diagnoses   Final diagnoses:  None    New Prescriptions New Prescriptions   No medications on file     Veryl Speak, MD 03/29/17 1155

## 2017-03-29 NOTE — ED Triage Notes (Signed)
Pain to the right side of her face since yesterday. She has facial droop noted to the right side of her face during triage exam. States her voice is raspy. She thinks she has bells palsy.

## 2018-05-29 ENCOUNTER — Encounter (HOSPITAL_BASED_OUTPATIENT_CLINIC_OR_DEPARTMENT_OTHER): Payer: Self-pay

## 2018-05-29 ENCOUNTER — Other Ambulatory Visit: Payer: Self-pay

## 2018-05-29 ENCOUNTER — Inpatient Hospital Stay (HOSPITAL_BASED_OUTPATIENT_CLINIC_OR_DEPARTMENT_OTHER)
Admission: EM | Admit: 2018-05-29 | Discharge: 2018-05-30 | Disposition: A | Discharge status: Home / Self Care | Payer: Medicare Other | Source: Ambulatory Visit | Attending: Obstetrics and Gynecology | Admitting: Obstetrics and Gynecology

## 2018-05-29 DIAGNOSIS — N9089 Other specified noninflammatory disorders of vulva and perineum: Secondary | ICD-10-CM | POA: Diagnosis not present

## 2018-05-29 DIAGNOSIS — N9489 Other specified conditions associated with female genital organs and menstrual cycle: Secondary | ICD-10-CM

## 2018-05-29 DIAGNOSIS — R102 Pelvic and perineal pain: Secondary | ICD-10-CM | POA: Diagnosis not present

## 2018-05-29 DIAGNOSIS — R109 Unspecified abdominal pain: Secondary | ICD-10-CM | POA: Insufficient documentation

## 2018-05-29 HISTORY — DX: Rheumatoid arthritis, unspecified: M06.9

## 2018-05-29 HISTORY — DX: Other specified follicular disorders: L73.8

## 2018-05-29 LAB — CBC WITH DIFFERENTIAL/PLATELET
BASOS ABS: 0 10*3/uL (ref 0.0–0.1)
Basophils Relative: 0 %
Eosinophils Absolute: 0.1 10*3/uL (ref 0.0–0.7)
Eosinophils Relative: 1 %
HCT: 39 % (ref 36.0–46.0)
Hemoglobin: 13 g/dL (ref 12.0–15.0)
LYMPHS PCT: 50 %
Lymphs Abs: 3.7 10*3/uL (ref 0.7–4.0)
MCH: 26.7 pg (ref 26.0–34.0)
MCHC: 33.3 g/dL (ref 30.0–36.0)
MCV: 80.2 fL (ref 78.0–100.0)
Monocytes Absolute: 0.7 10*3/uL (ref 0.1–1.0)
Monocytes Relative: 10 %
Neutro Abs: 3 10*3/uL (ref 1.7–7.7)
Neutrophils Relative %: 39 %
PLATELETS: 310 10*3/uL (ref 150–400)
RBC: 4.86 MIL/uL (ref 3.87–5.11)
RDW: 14.9 % (ref 11.5–15.5)
WBC: 7.5 10*3/uL (ref 4.0–10.5)

## 2018-05-29 LAB — BASIC METABOLIC PANEL
ANION GAP: 7 (ref 5–15)
BUN: 16 mg/dL (ref 6–20)
CALCIUM: 9 mg/dL (ref 8.9–10.3)
CO2: 28 mmol/L (ref 22–32)
Chloride: 105 mmol/L (ref 101–111)
Creatinine, Ser: 1.35 mg/dL — ABNORMAL HIGH (ref 0.44–1.00)
GFR calc Af Amer: 52 mL/min — ABNORMAL LOW (ref >60)
GFR, EST NON AFRICAN AMERICAN: 45 mL/min — AB (ref >60)
GLUCOSE: 92 mg/dL (ref 65–99)
Potassium: 3.9 mmol/L (ref 3.5–5.1)
Sodium: 140 mmol/L (ref 135–145)

## 2018-05-29 MED ORDER — ONDANSETRON HCL 8 MG PO TABS
4.0000 mg | ORAL_TABLET | Freq: Once | ORAL | Status: DC
  Filled 2018-05-29: qty 1

## 2018-05-29 MED ORDER — MORPHINE SULFATE (PF) 4 MG/ML IV SOLN
4.0000 mg | Freq: Once | INTRAVENOUS | Status: AC
  Administered 2018-05-29: 4 mg via INTRAVENOUS
  Filled 2018-05-29: qty 1

## 2018-05-29 MED ORDER — ONDANSETRON HCL 4 MG/2ML IJ SOLN
4.0000 mg | Freq: Once | INTRAMUSCULAR | Status: AC
  Administered 2018-05-29: 4 mg via INTRAVENOUS
  Filled 2018-05-29: qty 2

## 2018-05-29 MED ORDER — ONDANSETRON 4 MG PO TBDP
4.0000 mg | ORAL_TABLET | Freq: Once | ORAL | Status: AC
  Administered 2018-05-29: 4 mg via ORAL
  Filled 2018-05-29: qty 1

## 2018-05-29 MED ORDER — OXYCODONE-ACETAMINOPHEN 5-325 MG PO TABS
1.0000 | ORAL_TABLET | Freq: Once | ORAL | Status: AC
  Administered 2018-05-29: 1 via ORAL
  Filled 2018-05-29: qty 1

## 2018-05-29 NOTE — ED Notes (Signed)
[facility] notified ([name]) -  Patient transferred to [facility]

## 2018-05-29 NOTE — ED Provider Notes (Signed)
[facility] EMERGENCY DEPARTMENT Provider Note   CSN: [id number] Arrival date & time: 05/29/18  1842     History   Chief Complaint Chief Complaint  Patient presents with  . Recurrent Skin Infections    HPI Kelly Valenzuela is a 52 y.o. female.  The history is provided by the patient and medical records. No language interpreter was used.  Abdominal Pain   This is a new problem. The current episode started more than 1 week ago. The problem occurs constantly. The problem has been rapidly worsening. The pain is associated with an unknown factor. The pain is located in the perineum (vagoinal). The pain is at a severity of 10/10. The pain is severe. Pertinent negatives include fever, diarrhea, melena, nausea, vomiting, constipation, dysuria, frequency and headaches. The symptoms are aggravated by palpation. Nothing relieves the symptoms. Past workup includes surgery.    Past Medical History:  Diagnosis Date  . Anxiety   . Hypertension     Patient Active Problem List   Diagnosis Date Noted  . Arthritis of knee, left 03/12/2016  . Left knee pain 01/26/2016  . Metatarsal deformity 12/18/2014  . Equinus deformity of foot, acquired 12/18/2014  . Pronation deformity of ankle, acquired 12/18/2014  . Metatarsalgia of both feet 12/18/2014  . H/O: hysterectomy 11/21/2014  . HLD (hyperlipidemia) 07/26/2014  . Hyperinsulinemia 07/26/2014  . Obesity, diabetes, and hypertension syndrome ([place]) 07/24/2014  . Body mass index (BMI) of 50-59.9 in adult ([place]) 06/05/2014  . Anxiety 06/05/2014  . BP (high blood pressure) 06/05/2014  . Apnea, sleep 06/05/2014  . History of surgical procedure 06/05/2014  . Bipolar disorder, current episode mixed, moderate ([place]) 03/18/2014  . 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 Degeneration of intervertebral disc of lumbosacral region 02/01/2014  . Acid reflux 05/26/2013    Past Surgical History:  Procedure Laterality Date  . ABDOMINAL HYSTERECTOMY    .  VAGINA RECONSTRUCTION SURGERY       OB History   None      Home Medications    Prior to Admission medications   Medication Sig Start Date End Date Taking? Authorizing Provider  carvedilol (COREG) 6.25 MG tablet TK 1 T PO  BID 01/18/16  Yes [provider]  DEXILANT 60 MG capsule TK ONE C PO BID 01/10/16  Yes [provider]  hydrOXYzine (VISTARIL) 25 MG capsule Take 1 capsule by mouth 3 (three) times daily. 05/01/14  Yes [provider]  leflunomide (ARAVA) 20 MG tablet Take 1 tablet by mouth daily. 05/17/18  Yes [provider]  Suvorexant (BELSOMRA) 20 MG TABS Take by mouth.   Yes [provider]  tiZANidine (ZANAFLEX) 4 MG tablet Take 1 tablet (4 mg total) by mouth every 6 (six) hours as needed for muscle spasms. 03/31/16  Yes Hudnall, Sharyn Lull, MD  alprazolam Duanne Moron) 2 MG tablet TK 1 T PO  QID 01/02/16   [provider]  amoxicillin (AMOXIL) 500 MG capsule Take 1 capsule (500 mg total) by mouth 3 (three) times daily. 03/29/17   Veryl Speak, MD  Candesartan Cilexetil-HCTZ 32-25 MG TABS Take 1 tablet by mouth daily. 04/03/18   [provider]  diclofenac sodium (VOLTAREN) 1 % GEL Apply 4 g topically 4 (four) times daily. Patient not taking: Reported on 05/29/2018 01/21/16   Dene Gentry, MD  fluconazole (DIFLUCAN) 150 MG tablet Take 1 tablet (150 mg total) by mouth daily. 03/29/17   Veryl Speak, MD  losartan (COZAAR) 100 MG tablet  03/15/16  [provider]  naproxen (NAPROSYN) 500 MG tablet Take 1 tablet (500 mg total) by mouth 2 (two) times daily. Patient not taking: Reported on 05/29/2018 12/17/15   Pixie Casino, MD  OLUMIANT 2 MG TABS Take 2 mg by mouth daily. 05/09/18   [provider]  OMEPRAZOLE PO Take by mouth.    [provider]  ziprasidone (GEODON) 80 MG capsule Take 1 capsule by mouth daily. 05/18/18   [provider]  zolpidem (AMBIEN) 10 MG tablet  01/14/16   [provider]    Family History No family history on file.  Social History Social History   Tobacco Use  . Smoking status: Never Smoker  . Smokeless tobacco: Never Used  Substance Use Topics  . Alcohol use: No    Alcohol/week: 0.0 oz  . Drug use: No     Allergies   Hydrocodone; Amlodipine; Gabapentin; Pramipexole; Pregabalin; and Ropinirole   Review of Systems Review of Systems  Constitutional: Negative for fever.  HENT: Negative for congestion.   Respiratory: Negative for cough, chest tightness and shortness of breath.   Cardiovascular: Negative for chest pain.  Gastrointestinal: Negative for abdominal pain, constipation, diarrhea, melena, nausea and vomiting.  Genitourinary: Positive for pelvic pain and vaginal pain. Negative for dysuria, flank pain, frequency, vaginal bleeding and vaginal discharge.  Musculoskeletal: Negative for back pain, neck pain and neck stiffness.  Skin: Negative for rash and wound.  Neurological: Negative for headaches.  Psychiatric/Behavioral: Negative for agitation.     Physical Exam Updated Vital Signs BP (!) 162/109 (BP Location: Right Arm)   Pulse 81   Temp 98.5 F (36.9 C) (Oral)   Resp 18   Ht 5\' 9"  (1.753 m)   Wt (!) 158.8 kg (350 lb)   SpO2 100%   BMI 51.69 kg/m   Physical Exam  Constitutional: She appears well-developed and well-nourished. No distress.  HENT:  Head: Normocephalic and atraumatic.  Eyes: Conjunctivae are normal.  Neck: Neck supple.  Cardiovascular: Normal rate and regular rhythm.  No murmur heard. Pulmonary/Chest: Effort normal and breath sounds normal. No respiratory distress.  Abdominal: Soft. There is no tenderness. Hernia confirmed negative in the right inguinal area and confirmed negative in the left inguinal area.  Genitourinary:    Pelvic exam was performed with patient supine. There is tenderness on the right labia. There is no rash on the right labia. There is tenderness on the left labia.  There is no rash on the left labia. No vaginal discharge found.  Musculoskeletal: She exhibits no edema.  Neurological: She is alert.  Skin: Skin is warm and dry.  Psychiatric: She has a normal mood and affect.  Nursing note and vitals reviewed.    ED Treatments / Results  Labs (all labs ordered are listed, but only abnormal results are displayed) Labs Reviewed  BASIC METABOLIC PANEL - Abnormal; Notable for the following components:      Result Value   Creatinine, Ser 1.35 (*)    GFR calc non Af Amer 45 (*)    GFR calc Af Amer 52 (*)    All other components within normal limits  CBC WITH DIFFERENTIAL/PLATELET    EKG None  Radiology No results found.  Procedures Procedures (including critical care time)  Medications Ordered in ED Medications  oxyCODONE-acetaminophen (PERCOCET/ROXICET) 5-325 MG per tablet 1 tablet (1 tablet Oral Given 05/29/18 2033)  ondansetron (ZOFRAN-ODT) disintegrating tablet 4 mg (4 mg Oral Given 05/29/18 2033)  morphine 4 MG/ML injection  4 mg (4 mg Intravenous Given 05/29/18 2239)  ondansetron (ZOFRAN) injection 4 mg (4 mg Intravenous Given 05/29/18 2239)     Initial Impression / Assessment and Plan / ED Course  I have reviewed the triage vital signs and the nursing notes.  Pertinent labs & imaging results that were available during my care of the patient were reviewed by me and considered in my medical decision making (see chart for details).     Kelly Valenzuela is a 52 y.o. 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  Patient reports that she has "the same problem I had when I needed surgery".  She says that over the last few weeks she has had worsening pain and swelling to the point where it is unbearable.  She reports her pain is 10 out of 10.  She says that people have thought  they were boils in the past and tried to incise them and the patient had to be rushed off to the emergency department and subsequently operating room multiple times.  Patient reports that she is a "bleeder".  She denies any actual coagulation disorders.  She reports that she has had some nausea with the pain being severe and had some chills.  She denies any vaginal discharge, other abdominal pain, emesis, constipation, diarrhea, or trauma.  She denies any other complaints.  She reports that she called her previous OB/GYN who told her he could see her in several weeks.  Due to the patient's severe pain and nausea, patient came to the emergency department for evaluation.  On exam, patient has several tender nodules in her bilateral vaginal folds.  There is no surrounding erythema or crepitance.  Patient did not appear to have vaginal discharge on external exam.  Abdomen nontender and lungs are clear.  Chest is nontender.  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  Given her report that she always has to have them dealt with in the operating room, I do not feel the emergency department is an appropriate place to treat them at this time.  also, this facility does not have an operating room or a large amount of blood to transfuse if needed.  The OB/GYN team will be called for management recommendations.  Patient was given pain medicine and nausea medicine for symptoms.  10:20 PM Spoke with the [facility] team with Dr. Ilda Basset.  He recommended we offer the patient go see her previous OB/GYN team within the week for system.  He reported that if patient refused to see them and was still in uncontrolled pain, he would accept her in transfer to the MAU for evaluation.  He doubt that there would be any procedures done in the near future however he agreed to assess the patient.  On my reassessment, patient still reporting 10 out  of 10 pain with nausea.  Patient is uncomfortable with going home and did  not want to go to [facility] or see her same OB/GYN team.  Due to patient preference, patient will be transferred to the MAU for gynecology evaluation for the pain likely secondary to thrombosed vaginal varices and her report of severe bleeding with interventions.    Final Clinical Impressions(s) / ED Diagnoses   Final diagnoses:  Vaginal pain    Clinical Impression: 1. Vaginal pain  Disposition: Patient transferred to [facility] MAU for GYN evaluation.    Wael Maestas, Gwenyth Allegra, MD 05/30/18 [phone]

## 2018-05-29 NOTE — MAU Note (Signed)
Patient transported from [facility].  Patient states she was told she has 2 areas where the blood vessels broke and have been leaking causing discomfort.  Pain is throbbing and aching.  States this has been an ongoing issue but got to the point that she couldn't take it anymore.  Was taking tylenol for the pain at home.  No VB/discharge.

## 2018-05-29 NOTE — ED Notes (Signed)
Report given to Deborah, RN

## 2018-05-29 NOTE — ED Triage Notes (Signed)
C/o vaginal "boil" x 2 x 3 weeks-NAD-slow gait

## 2018-05-30 DIAGNOSIS — R102 Pelvic and perineal pain: Secondary | ICD-10-CM | POA: Diagnosis not present

## 2018-05-30 DIAGNOSIS — N9489 Other specified conditions associated with female genital organs and menstrual cycle: Secondary | ICD-10-CM

## 2018-05-30 DIAGNOSIS — N9089 Other specified noninflammatory disorders of vulva and perineum: Secondary | ICD-10-CM | POA: Diagnosis not present

## 2018-05-30 LAB — URINALYSIS, ROUTINE W REFLEX MICROSCOPIC
Bilirubin Urine: NEGATIVE
GLUCOSE, UA: NEGATIVE mg/dL
HGB URINE DIPSTICK: NEGATIVE
Ketones, ur: NEGATIVE mg/dL
Leukocytes, UA: NEGATIVE
Nitrite: NEGATIVE
PH: 6 (ref 5.0–8.0)
Protein, ur: NEGATIVE mg/dL
SPECIFIC GRAVITY, URINE: 1.016 (ref 1.005–1.030)

## 2018-05-30 MED ORDER — OXYCODONE HCL 5 MG PO TABS: 5.0000 mg | ORAL_TABLET | ORAL | 0 refills | Status: DC | PRN

## 2018-05-30 MED ORDER — PROMETHAZINE HCL 25 MG PO TABS: 12.5000 mg | ORAL_TABLET | Freq: Three times a day (TID) | ORAL | 0 refills | Status: DC | PRN

## 2018-05-30 MED ORDER — PROMETHAZINE HCL 25 MG/ML IJ SOLN
6.2500 mg | Freq: Once | INTRAMUSCULAR | Status: AC
  Administered 2018-05-30: 6.25 mg via INTRAVENOUS
  Filled 2018-05-30: qty 1

## 2018-05-30 MED ORDER — FENTANYL CITRATE (PF) 100 MCG/2ML IJ SOLN
25.0000 ug | Freq: Once | INTRAMUSCULAR | Status: AC
Start: 2018-05-30 — End: 2018-05-30
  Administered 2018-05-30: 25 ug via INTRAVENOUS
  Filled 2018-05-30: qty 2

## 2018-05-30 NOTE — MAU Provider Note (Signed)
Maternity Admission Unit Note  Date: 05/30/2018   Requesting Provider: [facility]  Primary OBGYN: None Primary Care Provider: Benito Mccreedy  Chief Complaint: one week history of vaginal discomfort  History of Present Illness: Ms. Tinkle is a 52 y.o. G2P2 (No LMP recorded. Patient has had a hysterectomy.), with the above CC. Patient was being seen at by a group at [facility] but last visit with them was 03/2015 so they stated that she couldn't be seen in the office for several weeks so she went to [facility] for evaluation.   Patient seen at [facility] and they felt she needed formal GYN evaluation so she was transferred to the MAU.   Patient with above CC that has grown in size despite sitz baths, warm and cool compresses. Patient states they usually go away with measures at home but not this time. Patient states she had something similar that was removed at [facility] but these were more vaginal.  Some nausea but no vomiting and no fevers, chills, diarrhea, constipation, VB    ROS: A 12-point review of systems was performed and negative, except as stated in the above HPI.  OBGYN History: As per HPI. OB History  Gravida Para Term Preterm AB Living  2 2 2         SAB TAB Ectopic Multiple Live Births               # Outcome Date GA Lbr Len/2nd Weight Sex Delivery Anes PTL Lv  2 Term 55     Vag-Spont     1 Term 60     Vag-Spont       Past Medical History: Past Medical History:  Diagnosis Date  . Anxiety   . Hypertension   . Rheumatoid arthritis ([place])   . Sebaceous hyperplasia of vulva     Past Surgical History: Past Surgical History:  Procedure Laterality Date  . ABDOMINAL HYSTERECTOMY    . STOMACH SURGERY    . VAGINA RECONSTRUCTION SURGERY      Family History:  No family history on file.  Social History:  Social History   Socioeconomic History  . Marital status: Single    Spouse name: Not on file  . Number of children: Not on file  . Years of education:  Not on file  . Highest education level: Not on file  Occupational History  . Not on file  Social Needs  . Financial resource strain: Not on file  . Food insecurity:    Worry: Not on file    Inability: Not on file  . Transportation needs:    Medical: Not on file    Non-medical: Not on file  Tobacco Use  . Smoking status: Never Smoker  . Smokeless tobacco: Never Used  Substance and Sexual Activity  . Alcohol use: No    Alcohol/week: 0.0 oz  . Drug use: No  . Sexual activity: Not on file  Lifestyle  . Physical activity:    Days per week: Not on file    Minutes per session: Not on file  . Stress: Not on file  Relationships  . Social connections:    Talks on phone: Not on file    Gets together: Not on file    Attends religious service: Not on file    Active member of club or organization: Not on file    Attends meetings of clubs or organizations: Not on file    Relationship status: Not on file  . Intimate  partner violence:    Fear of current or ex partner: Not on file    Emotionally abused: Not on file    Physically abused: Not on file    Forced sexual activity: Not on file  Other Topics Concern  . Not on file  Social History Narrative  . Not on file    Allergy: Allergies  Allergen Reactions  . Hydrocodone Other (See Comments)    Severe headache  . Amlodipine     Other reaction(s): SWELLING  . Gabapentin     Other reaction(s): NAUSEA  . Pramipexole     Other reaction(s): NAUSEA  . Pregabalin     Other reaction(s): VOMITING  . Ropinirole     Other reaction(s): VOMITING    Current Outpatient Medications: Medications Prior to Admission  Medication Sig Dispense Refill Last Dose  . carvedilol (COREG) 6.25 MG tablet TK 1 T PO  BID  3   . DEXILANT 60 MG capsule TK ONE C PO BID  11   . hydrOXYzine (VISTARIL) 25 MG capsule Take 1 capsule by mouth 3 (three) times daily.     Marland Kitchen leflunomide (ARAVA) 20 MG tablet Take 1 tablet by mouth daily.     . Suvorexant  (BELSOMRA) 20 MG TABS Take by mouth.     Marland Kitchen tiZANidine (ZANAFLEX) 4 MG tablet Take 1 tablet (4 mg total) by mouth every 6 (six) hours as needed for muscle spasms. 60 tablet 0   . alprazolam (XANAX) 2 MG tablet TK 1 T PO  QID  1   . amoxicillin (AMOXIL) 500 MG capsule Take 1 capsule (500 mg total) by mouth 3 (three) times daily. 21 capsule 0   . Candesartan Cilexetil-HCTZ 32-25 MG TABS Take 1 tablet by mouth daily.  3   . diclofenac sodium (VOLTAREN) 1 % GEL Apply 4 g topically 4 (four) times daily. (Patient not taking: Reported on 05/29/2018) 5 Tube 1 Not Taking at Unknown time  . fluconazole (DIFLUCAN) 150 MG tablet Take 1 tablet (150 mg total) by mouth daily. 2 tablet 0   . losartan (COZAAR) 100 MG tablet      . naproxen (NAPROSYN) 500 MG tablet Take 1 tablet (500 mg total) by mouth 2 (two) times daily. (Patient not taking: Reported on 05/29/2018) 30 tablet 0 Not Taking at Unknown time  . OLUMIANT 2 MG TABS Take 2 mg by mouth daily.     Marland Kitchen OMEPRAZOLE PO Take by mouth.     . ziprasidone (GEODON) 80 MG capsule Take 1 capsule by mouth daily.     Marland Kitchen zolpidem (AMBIEN) 10 MG tablet   1       Physical Exam:  Current Vital Signs 24h Vital Sign Ranges  T 98.4 F (36.9 C) Temp  Avg: 98.5 F (36.9 C)  Min: 98.4 F (36.9 C)  Max: 98.5 F (36.9 C)  BP (!) 145/76 BP  Min: 145/76  Max: 162/109  HR 75 Pulse  Avg: 78.5  Min: 75  Max: 81  RR 19 Resp  Avg: 17.3  Min: 16  Max: 19  SaO2 98 % Room Air SpO2  Avg: 98.7 %  Min: 98 %  Max: 100 %       24 Hour I/O Current Shift I/O  Time Ins Outs No intake/output data recorded. No intake/output data recorded.   Patient Vitals for the past 24 hrs:  BP Temp Temp src Pulse Resp SpO2 Height Weight  05/29/18 2356 (!) 145/76 98.4 F (36.9 C) -  75 19 - - -  05/29/18 2240 (!) 159/90 - - 79 16 98 % - -  05/29/18 2127 (!) 145/80 - - 79 16 98 % - -  05/29/18 1855 (!) 162/109 98.5 F (36.9 C) Oral 81 18 100 % 5\' 9"  (1.753 m) (!) 350 lb (158.8 kg)    Body mass  index is 51.69 kg/m. General appearance: Well nourished, well developed female in no acute distress.  Cardiovascular: S1, S2 normal, no murmur, rub or gallop, regular rate and rhythm Respiratory:  Clear to auscultation bilateral. Normal respiratory effort Abdomen: positive bowel sounds and no masses, hernias; diffusely non tender to palpation, non distended Neuro/Psych:  Normal mood and affect.  Skin:  Warm and dry.  Extremities: no clubbing, cyanosis, or edema.  Lymphatic:  No inguinal lymphadenopathy.   Pelvic exam:  EGBUS normal on inspection. At the bilateral labia majora at 10 and 2 o'clock there are mild to moderately ttp 1.5cm x 1.5cm circumferential nodules that are mobile, hard, with no fluctuance; the overlying skin is normal and there is no e/o infection   Laboratory: Recent Labs  Lab 05/29/18 2221  WBC 7.5  HGB 13.0  HCT 39.0  PLT 310   Recent Labs  Lab 05/29/18 2221  NA 140  K 3.9  CL 105  CO2 28  BUN 16  CREATININE 1.35*  CALCIUM 9.0  GLUCOSE 92    Imaging:  none  Assessment: patient stable with b/l labia majora nodules  Plan: Patient stable and can be seen as an outpatient for possible surgical intervention. Pt lives in [place]. Inbasket request sent to [facility] office for appointment later today.  Oxycodone 5mg  #15 given to patient after checking database. PRN phenergan also given.   Total time taking care of the patient was 25 minutes, with greater than 50% of the time spent in face to face interaction with the patient.  Durene Romans MD Attending Center for [facility])

## 2018-05-31 ENCOUNTER — Ambulatory Visit (INDEPENDENT_AMBULATORY_CARE_PROVIDER_SITE_OTHER): Payer: Medicare Other | Admitting: Obstetrics & Gynecology

## 2018-05-31 ENCOUNTER — Encounter: Payer: Self-pay | Admitting: Obstetrics & Gynecology

## 2018-05-31 VITALS — BP 145/96 | HR 74 | Ht 69.0 in | Wt 333.0 lb

## 2018-05-31 DIAGNOSIS — L739 Follicular disorder, unspecified: Secondary | ICD-10-CM | POA: Diagnosis not present

## 2018-05-31 MED ORDER — SULFAMETHOXAZOLE-TRIMETHOPRIM 800-160 MG PO TABS: 1.0000 | ORAL_TABLET | Freq: Two times a day (BID) | ORAL | 0 refills | Status: DC

## 2018-05-31 NOTE — Progress Notes (Signed)
Pt c/o "painful lumps on the outside of vagina"

## 2018-05-31 NOTE — Progress Notes (Signed)
   Subjective:    Patient ID: Kelly Valenzuela, female    DOB: 01-16-1966, 52 y.o.   MRN: [id number]  HPI 52 yo P2 here with painful "boils". She reports that they are improved since yesterday. She was seen at the ER and MAU and given some percocet.  Review of Systems     Objective:   Physical Exam Morbidly obese Breathing, conversing, and ambulating normally Well nourished, well hydrated Black female, no apparent distress Folliculitis noted- one area on each labia majora     Assessment & Plan:  Preventative care- She generally gets her gyn care with Dr. Posey Pronto of [facility] Folliculitis- bactrim ds for a week Rec warm compresses I explained that I don't operate on folliculitis.

## 2018-08-29 ENCOUNTER — Other Ambulatory Visit (HOSPITAL_COMMUNITY): Payer: Self-pay

## 2018-08-29 ENCOUNTER — Encounter (HOSPITAL_COMMUNITY): Payer: Self-pay | Admitting: Psychiatry

## 2018-08-29 ENCOUNTER — Other Ambulatory Visit (HOSPITAL_COMMUNITY): Payer: Self-pay | Admitting: Psychiatry

## 2018-08-29 ENCOUNTER — Ambulatory Visit (INDEPENDENT_AMBULATORY_CARE_PROVIDER_SITE_OTHER): Payer: Medicare Other | Admitting: Psychiatry

## 2018-08-29 VITALS — BP 122/80 | HR 90 | Ht 69.0 in | Wt 340.0 lb

## 2018-08-29 DIAGNOSIS — F4310 Post-traumatic stress disorder, unspecified: Secondary | ICD-10-CM

## 2018-08-29 DIAGNOSIS — F410 Panic disorder [episodic paroxysmal anxiety] without agoraphobia: Secondary | ICD-10-CM | POA: Diagnosis not present

## 2018-08-29 DIAGNOSIS — F5102 Adjustment insomnia: Secondary | ICD-10-CM

## 2018-08-29 DIAGNOSIS — F3130 Bipolar disorder, current episode depressed, mild or moderate severity, unspecified: Secondary | ICD-10-CM | POA: Diagnosis not present

## 2018-08-29 DIAGNOSIS — F411 Generalized anxiety disorder: Secondary | ICD-10-CM

## 2018-08-29 MED ORDER — PROMETHAZINE HCL 12.5 MG PO TABS: 12.5000 mg | ORAL_TABLET | Freq: Two times a day (BID) | ORAL | 1 refills | Status: DC | PRN

## 2018-08-29 MED ORDER — ZIPRASIDONE HCL 60 MG PO CAPS: 60.0000 mg | ORAL_CAPSULE | Freq: Every day | ORAL | 1 refills | Status: DC

## 2018-08-29 MED ORDER — ZIPRASIDONE HCL 60 MG PO CAPS: 60.0000 mg | ORAL_CAPSULE | Freq: Two times a day (BID) | ORAL | 1 refills | Status: DC

## 2018-08-29 MED ORDER — HYDROXYZINE PAMOATE 25 MG PO CAPS: 25.0000 mg | ORAL_CAPSULE | Freq: Two times a day (BID) | ORAL | 1 refills | Status: AC | PRN

## 2018-08-29 MED ORDER — CITALOPRAM HYDROBROMIDE 10 MG PO TABS: 10.0000 mg | ORAL_TABLET | Freq: Every day | ORAL | 1 refills | Status: DC

## 2018-08-29 MED ORDER — SUVOREXANT 20 MG PO TABS: 20.0000 mg | ORAL_TABLET | Freq: Every day | ORAL | 0 refills | Status: AC
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
## 2018-09-28 ENCOUNTER — Ambulatory Visit (HOSPITAL_COMMUNITY): Payer: Self-pay | Admitting: Psychiatry

## 2018-12-21 ENCOUNTER — Encounter (HOSPITAL_BASED_OUTPATIENT_CLINIC_OR_DEPARTMENT_OTHER): Payer: Self-pay

## 2018-12-21 ENCOUNTER — Other Ambulatory Visit: Payer: Self-pay

## 2018-12-21 ENCOUNTER — Emergency Department (HOSPITAL_BASED_OUTPATIENT_CLINIC_OR_DEPARTMENT_OTHER)
Admission: EM | Admit: 2018-12-21 | Discharge: 2018-12-21 | Disposition: A | Discharge status: Home / Self Care | Payer: Medicare Other | Attending: Emergency Medicine | Admitting: Emergency Medicine

## 2018-12-21 DIAGNOSIS — Z79899 Other long term (current) drug therapy: Secondary | ICD-10-CM | POA: Insufficient documentation

## 2018-12-21 DIAGNOSIS — J069 Acute upper respiratory infection, unspecified: Secondary | ICD-10-CM | POA: Diagnosis not present

## 2018-12-21 DIAGNOSIS — B9789 Other viral agents as the cause of diseases classified elsewhere: Secondary | ICD-10-CM | POA: Diagnosis not present

## 2018-12-21 DIAGNOSIS — R05 Cough: Secondary | ICD-10-CM | POA: Diagnosis present

## 2018-12-21 DIAGNOSIS — I10 Essential (primary) hypertension: Secondary | ICD-10-CM | POA: Diagnosis not present

## 2018-12-21 MED ORDER — PREDNISONE 50 MG PO TABS: 50.0000 mg | ORAL_TABLET | Freq: Every day | ORAL | 0 refills | Status: DC

## 2018-12-21 MED ORDER — GUAIFENESIN ER 1200 MG PO TB12: 1.0000 | ORAL_TABLET | Freq: Two times a day (BID) | ORAL | 0 refills | Status: DC

## 2018-12-21 MED ORDER — PROMETHAZINE-DM 6.25-15 MG/5ML PO SYRP: 5.0000 mL | ORAL_SOLUTION | Freq: Four times a day (QID) | ORAL | 0 refills | Status: DC | PRN

## 2018-12-21 MED ORDER — BUDESONIDE 32 MCG/ACT NA SUSP: 2.0000 | Freq: Every day | NASAL | 0 refills | Status: DC

## 2018-12-21 MED ORDER — DEXAMETHASONE SODIUM PHOSPHATE 10 MG/ML IJ SOLN
10.0000 mg | Freq: Once | INTRAMUSCULAR | Status: AC
  Administered 2018-12-21: 10 mg via INTRAMUSCULAR
  Filled 2018-12-21: qty 1

## 2018-12-21 MED FILL — PROMETHAZINE W/DM SYRUP: 6.25-15 | 6 days supply | Qty: 120 | Fill #0

## 2018-12-21 MED FILL — predniSONE 50 MG TABS: 50 | 5 days supply | Qty: 5 | Fill #0

## 2018-12-21 NOTE — Discharge Instructions (Addendum)
Return here as needed.  Follow-up with your doctor.  Increase your fluid intake and rest as much as possible. °

## 2018-12-21 NOTE — ED Triage Notes (Signed)
C/o URI sx x 4 days-NAD-steady gait

## 2018-12-22 NOTE — ED Provider Notes (Signed)
[facility] EMERGENCY DEPARTMENT Provider Note   CSN: [id number] Arrival date & time: 12/21/18  1246     History   Chief Complaint Chief Complaint  Patient presents with  . URI    HPI Kelly Valenzuela is a 53 y.o. female.  HPI Patient presents to the emergency department with cough, nasal congestion, nasal drainage, body aches over the last 4 days.  Patient states that nothing seems make her condition better or worse.  She states she did not take any medications prior to arrival for her symptoms.  The patient denies chest pain, shortness of breath, headache,blurred vision, neck pain,weakness, numbness, dizziness, anorexia, edema, abdominal pain, nausea, vomiting, diarrhea, rash, back pain, dysuria, hematemesis, bloody stool, near syncope, or syncope. Past Medical History:  Diagnosis Date  . Anxiety   . Hypertension   . Rheumatoid arthritis ([place])   . Sebaceous hyperplasia of vulva     Patient Active Problem List   Diagnosis Date Noted  . Arthritis of knee, left 03/12/2016  . Left knee pain 01/26/2016  . Metatarsal deformity 12/18/2014  . Equinus deformity of foot, acquired 12/18/2014  . Pronation deformity of ankle, acquired 12/18/2014  . Metatarsalgia of both feet 12/18/2014  . H/O: hysterectomy 11/21/2014  . HLD (hyperlipidemia) 07/26/2014  . Hyperinsulinemia 07/26/2014  . Obesity, diabetes, and hypertension syndrome ([place]) 07/24/2014  . Body mass index (BMI) of 50-59.9 in adult ([place]) 06/05/2014  . Anxiety 06/05/2014  . BP (high blood pressure) 06/05/2014  . Apnea, sleep 06/05/2014  . History of surgical procedure 06/05/2014  . Bipolar disorder, current episode mixed, moderate ([place]) 03/18/2014  . 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 Degeneration of intervertebral disc of lumbosacral region 02/01/2014  . Acid reflux 05/26/2013    Past Surgical History:  Procedure Laterality Date  . ABDOMINAL HYSTERECTOMY    . STOMACH SURGERY    . VAGINA RECONSTRUCTION SURGERY        OB History    Gravida  2   Para  2   Term  2   Preterm      AB      Living        SAB      TAB      Ectopic      Multiple      Live Births               Home Medications    Prior to Admission medications   Medication Sig Start Date End Date Taking? Authorizing Provider  Candesartan Cilexetil-HCTZ 32-25 MG TABS Take 1 tablet by mouth daily. 04/03/18  Yes [provider]  carvedilol (COREG) 6.25 MG tablet TK 1 T PO  BID 01/18/16  Yes [provider]  DEXILANT 60 MG capsule TK ONE C PO BID 01/10/16  Yes [provider]  hydrOXYzine (VISTARIL) 25 MG capsule Take 1 capsule (25 mg total) by mouth 2 (two) times daily as needed. 08/29/18 08/29/19 Yes Merian Capron, MD  leflunomide (ARAVA) 20 MG tablet Take 1 tablet by mouth daily. 05/17/18  Yes [provider]  OLUMIANT 2 MG TABS Take 2 mg by mouth daily. 05/09/18  Yes [provider]  promethazine (PHENERGAN) 12.5 MG tablet Take 1-2 tablets (12.5-25 mg total) by mouth 2 (two) times daily as needed for nausea or vomiting. 08/29/18 08/29/19 Yes Merian Capron, MD  Suvorexant (BELSOMRA) 20 MG TABS Take 20 mg by mouth at bedtime. 08/29/18 08/29/19 Yes Merian Capron, MD  tiZANidine (ZANAFLEX) 4 MG tablet Take 1  tablet (4 mg total) by mouth every 6 (six) hours as needed for muscle spasms. 03/31/16  Yes Hudnall, Sharyn Lull, MD  budesonide (RHINOCORT AQUA) 32 MCG/ACT nasal spray Place 2 sprays into both nostrils daily. 12/21/18   Sopheap Basic, Harrell Gave, PA-C  citalopram (CELEXA) 10 MG tablet TAKE 1 TABLET(10 MG) BY MOUTH DAILY 08/29/18   Merian Capron, MD  diclofenac sodium (VOLTAREN) 1 % GEL Apply 4 g topically 4 (four) times daily. Patient not taking: Reported on 05/29/2018 01/21/16   Dene Gentry, MD  Guaifenesin 1200 MG TB12 Take 1 tablet (1,200 mg total) by mouth 2 (two) times daily. 12/21/18   Nemiah Bubar, Harrell Gave, PA-C  losartan (COZAAR) 100 MG tablet  03/15/16   [provider]    OMEPRAZOLE PO Take by mouth.    [provider]  oxyCODONE (ROXICODONE) 5 MG immediate release tablet Take 1 tablet (5 mg total) by mouth every 4 (four) hours as needed for severe pain. 05/30/18   Aletha Halim, MD  predniSONE (DELTASONE) 50 MG tablet Take 1 tablet (50 mg total) by mouth daily. 12/21/18   Mandie Crabbe, Harrell Gave, PA-C  promethazine-dextromethorphan (PROMETHAZINE-DM) 6.25-15 MG/5ML syrup Take 5 mLs by mouth 4 (four) times daily as needed for cough. 12/21/18   Pearson Reasons, Harrell Gave, PA-C  sulfamethoxazole-trimethoprim (BACTRIM DS,SEPTRA DS) 800-160 MG tablet Take 1 tablet by mouth 2 (two) times daily. Patient not taking: Reported on 08/29/2018 05/31/18   Emily Filbert, MD  ziprasidone (GEODON) 60 MG capsule Take 1 capsule (60 mg total) by mouth 2 (two) times daily with a meal. 08/29/18 08/29/19  Merian Capron, MD  zolpidem Lorrin Mais) 10 MG tablet  01/14/16 08/29/18  [provider]    Family History Family History  Problem Relation Age of Onset  . Alcohol abuse Father   . Depression Father   . Depression Sister   . Alcohol abuse Sister     Social History Social History   Tobacco Use  . Smoking status: Never Smoker  . Smokeless tobacco: Never Used  Substance Use Topics  . Alcohol use: No    Alcohol/week: 0.0 standard drinks  . Drug use: No     Allergies   Hydrocodone; Amlodipine; Gabapentin; Norvasc [amlodipine besylate]; Penicillins; Pramipexole; Pregabalin; and Ropinirole   Review of Systems Review of Systems All other systems negative except as documented in the HPI. All pertinent positives and negatives as reviewed in the HPI.  Physical Exam Updated Vital Signs BP (!) 158/104 (BP Location: Left Arm)   Pulse 73   Temp 97.9 F (36.6 C) (Oral)   Resp 18   Ht 5\' 9"  (1.753 m)   Wt (!) 157.9 kg   SpO2 100%   BMI 51.39 kg/m   Physical Exam Vitals signs and nursing note reviewed.  Constitutional:      General: She is not in acute distress.     Appearance: Normal appearance. She is well-developed.  HENT:     Head: Normocephalic and atraumatic.  Eyes:     Pupils: Pupils are equal, round, and reactive to light.  Neck:     Musculoskeletal: Normal range of motion and neck supple.  Cardiovascular:     Rate and Rhythm: Normal rate and regular rhythm.     Heart sounds: Normal heart sounds. No murmur. No friction rub. No gallop.   Pulmonary:     Effort: Pulmonary effort is normal. No respiratory distress.     Breath sounds: Normal breath sounds. No wheezing.  Skin:    General: Skin is  warm and dry.     Capillary Refill: Capillary refill takes less than 2 seconds.     Findings: No erythema or rash.  Neurological:     Mental Status: She is alert and oriented to person, place, and time.     Motor: No abnormal muscle tone.     Coordination: Coordination normal.  Psychiatric:        Behavior: Behavior normal.      ED Treatments / Results  Labs (all labs ordered are listed, but only abnormal results are displayed) Labs Reviewed - No data to display  EKG None  Radiology No results found.  Procedures Procedures (including critical care time)  Medications Ordered in ED Medications  dexamethasone (DECADRON) injection 10 mg (10 mg Intramuscular Given 12/21/18 1446)     Initial Impression / Assessment and Plan / ED Course  I have reviewed the triage vital signs and the nursing notes.  Pertinent labs & imaging results that were available during my care of the patient were reviewed by me and considered in my medical decision making (see chart for details).     Patient to be treated for influenza-like illness.  Told to return here as needed.  Patient agrees the plan and all questions answered.  Patient is been stable here in the emergency department.  Final Clinical Impressions(s) / ED Diagnoses   Final diagnoses:  Viral URI with cough    ED Discharge Orders         Ordered    predniSONE (DELTASONE) 50 MG tablet   Daily     12/21/18 1438    Guaifenesin 1200 MG TB12  2 times daily     12/21/18 1438    promethazine-dextromethorphan (PROMETHAZINE-DM) 6.25-15 MG/5ML syrup  4 times daily PRN     12/21/18 1438    budesonide (RHINOCORT AQUA) 32 MCG/ACT nasal spray  Daily     12/21/18 1438           Dalia Heading, PA-C 12/22/18 2003    Gareth Morgan, MD 12/23/18 1039

## 2019-06-23 ENCOUNTER — Other Ambulatory Visit: Payer: Self-pay

## 2019-06-23 ENCOUNTER — Encounter (HOSPITAL_BASED_OUTPATIENT_CLINIC_OR_DEPARTMENT_OTHER): Payer: Self-pay | Admitting: Emergency Medicine

## 2019-06-23 ENCOUNTER — Emergency Department (HOSPITAL_BASED_OUTPATIENT_CLINIC_OR_DEPARTMENT_OTHER)
Admission: EM | Admit: 2019-06-23 | Discharge: 2019-06-23 | Disposition: A | Discharge status: Home / Self Care | Payer: Medicare Other | Attending: Emergency Medicine | Admitting: Emergency Medicine

## 2019-06-23 DIAGNOSIS — B369 Superficial mycosis, unspecified: Secondary | ICD-10-CM

## 2019-06-23 DIAGNOSIS — Z79899 Other long term (current) drug therapy: Secondary | ICD-10-CM | POA: Insufficient documentation

## 2019-06-23 DIAGNOSIS — I10 Essential (primary) hypertension: Secondary | ICD-10-CM | POA: Insufficient documentation

## 2019-06-23 DIAGNOSIS — M069 Rheumatoid arthritis, unspecified: Secondary | ICD-10-CM | POA: Diagnosis not present

## 2019-06-23 DIAGNOSIS — L0889 Other specified local infections of the skin and subcutaneous tissue: Secondary | ICD-10-CM | POA: Insufficient documentation

## 2019-06-23 DIAGNOSIS — E65 Localized adiposity: Secondary | ICD-10-CM

## 2019-06-23 DIAGNOSIS — R339 Retention of urine, unspecified: Secondary | ICD-10-CM | POA: Diagnosis present

## 2019-06-23 DIAGNOSIS — N3000 Acute cystitis without hematuria: Secondary | ICD-10-CM

## 2019-06-23 LAB — CBC WITH DIFFERENTIAL/PLATELET
Abs Immature Granulocytes: 0.01 10*3/uL (ref 0.00–0.07)
Basophils Absolute: 0 10*3/uL (ref 0.0–0.1)
Basophils Relative: 1 %
Eosinophils Absolute: 0.2 10*3/uL (ref 0.0–0.5)
Eosinophils Relative: 3 %
HCT: 40.5 % (ref 36.0–46.0)
Hemoglobin: 12.3 g/dL (ref 12.0–15.0)
Immature Granulocytes: 0 %
Lymphocytes Relative: 40 %
Lymphs Abs: 2.4 10*3/uL (ref 0.7–4.0)
MCH: 27 pg (ref 26.0–34.0)
MCHC: 30.4 g/dL (ref 30.0–36.0)
MCV: 89 fL (ref 80.0–100.0)
Monocytes Absolute: 0.5 10*3/uL (ref 0.1–1.0)
Monocytes Relative: 9 %
Neutro Abs: 2.9 10*3/uL (ref 1.7–7.7)
Neutrophils Relative %: 47 %
Platelets: 285 10*3/uL (ref 150–400)
RBC: 4.55 MIL/uL (ref 3.87–5.11)
RDW: 14.2 % (ref 11.5–15.5)
WBC: 6.1 10*3/uL (ref 4.0–10.5)
nRBC: 0 % (ref 0.0–0.2)

## 2019-06-23 LAB — URINALYSIS, ROUTINE W REFLEX MICROSCOPIC
Bilirubin Urine: NEGATIVE
Glucose, UA: NEGATIVE mg/dL
Hgb urine dipstick: NEGATIVE
Ketones, ur: NEGATIVE mg/dL
Nitrite: NEGATIVE
Protein, ur: NEGATIVE mg/dL
Specific Gravity, Urine: 1.025 (ref 1.005–1.030)
pH: 6 (ref 5.0–8.0)

## 2019-06-23 LAB — COMPREHENSIVE METABOLIC PANEL
ALT: 13 U/L (ref 0–44)
AST: 13 U/L — ABNORMAL LOW (ref 15–41)
Albumin: 3.7 g/dL (ref 3.5–5.0)
Alkaline Phosphatase: 64 U/L (ref 38–126)
Anion gap: 9 (ref 5–15)
BUN: 22 mg/dL — ABNORMAL HIGH (ref 6–20)
CO2: 26 mmol/L (ref 22–32)
Calcium: 9.6 mg/dL (ref 8.9–10.3)
Chloride: 106 mmol/L (ref 98–111)
Creatinine, Ser: 1.34 mg/dL — ABNORMAL HIGH (ref 0.44–1.00)
GFR calc Af Amer: 53 mL/min — ABNORMAL LOW (ref >60)
GFR calc non Af Amer: 45 mL/min — ABNORMAL LOW (ref >60)
Glucose, Bld: 123 mg/dL — ABNORMAL HIGH (ref 70–99)
Potassium: 3.8 mmol/L (ref 3.5–5.1)
Sodium: 141 mmol/L (ref 135–145)
Total Bilirubin: 0.6 mg/dL (ref 0.3–1.2)
Total Protein: 7 g/dL (ref 6.5–8.1)

## 2019-06-23 LAB — URINALYSIS, MICROSCOPIC (REFLEX)
RBC / HPF: NONE SEEN RBC/hpf (ref 0–5)
WBC, UA: >50 WBC/hpf (ref 0–5)

## 2019-06-23 MED ORDER — NITROFURANTOIN MONOHYD MACRO 100 MG PO CAPS: 100.0000 mg | ORAL_CAPSULE | Freq: Two times a day (BID) | ORAL | 0 refills | Status: AC

## 2019-06-23 MED ORDER — CLOTRIMAZOLE 1 % EX CREA: 1.0000 "application " | TOPICAL_CREAM | Freq: Two times a day (BID) | CUTANEOUS | 0 refills | Status: AC

## 2019-06-23 MED ORDER — CLOTRIMAZOLE 1 % EX CREA: 1.0000 "application " | TOPICAL_CREAM | Freq: Two times a day (BID) | CUTANEOUS | 0 refills | Status: DC

## 2019-06-23 NOTE — Discharge Instructions (Signed)
I have prescribed antibiotics to help treat your infection, please take 1 tablet twice a day for the next 7 days.  I have also prescribed cream to apply to your lower abdominal region, I have explained this to you at length.  If you experience any fever, back pain or chills please return to the emergency department.

## 2019-06-23 NOTE — ED Provider Notes (Signed)
[facility] DEPART[facility] Provider Note   CSN: [id number] Arrival date & time: 06/23/19  1229    History   Chief Complaint Chief Complaint  Patient presents with  . Urinary Retention    HPI Kelly Valenzuela is a 53 y.o. female.     53 y.o female with a Anxiety, HTN, RA presents to the ED with a chief complaint of urinary problems x 3 weeks. Patient reports about 3 weeks ago she started experiencing some new retention, states that she is able to urinate but feels like she is not fully emptying her bladder.  She also reports the smell to her urine is foul.  She reports trying to take Azo's over-the-counter but this has not improved her symptoms.  She also has tried Gatorade, cranberry juice but states no improvement. Patient also reports she's now experiencing dysuria, along with some lower back pain which she describes as throbbing in nature. She endorses multiple episodes of non-bloody, non bilious for the past 3 days. She denies any fever, chills, or saddle anesthesia.  Patient also reports a rash noted to   The history is provided by the patient.    Past Medical History:  Diagnosis Date  . Anxiety   . Hypertension   . Rheumatoid arthritis ([place])   . Sebaceous hyperplasia of vulva     Patient Active Problem List   Diagnosis Date Noted  . Arthritis of knee, left 03/12/2016  . Left knee pain 01/26/2016  . Metatarsal deformity 12/18/2014  . Equinus deformity of foot, acquired 12/18/2014  . Pronation deformity of ankle, acquired 12/18/2014  . Metatarsalgia of both feet 12/18/2014  . H/O: hysterectomy 11/21/2014  . HLD (hyperlipidemia) 07/26/2014  . Hyperinsulinemia 07/26/2014  . Obesity, diabetes, and hypertension syndrome ([place]) 07/24/2014  . Body mass index (BMI) of 50-59.9 in adult ([place]) 06/05/2014  . Anxiety 06/05/2014  . BP (high blood pressure) 06/05/2014  . Apnea, sleep 06/05/2014  . History of surgical procedure 06/05/2014  . Bipolar disorder,  current episode mixed, moderate ([place]) 03/18/2014  . 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 Degeneration of intervertebral disc of lumbosacral region 02/01/2014  . Acid reflux 05/26/2013    Past Surgical History:  Procedure Laterality Date  . ABDOMINAL HYSTERECTOMY    . STOMACH SURGERY    . VAGINA RECONSTRUCTION SURGERY       OB History    Gravida  2   Para  2   Term  2   Preterm      AB      Living        SAB      TAB      Ectopic      Multiple      Live Births               Home Medications    Prior to Admission medications   Medication Sig Start Date End Date Taking? Authorizing Provider  budesonide (RHINOCORT AQUA) 32 MCG/ACT nasal spray Place 2 sprays into both nostrils daily. 12/21/18   Lawyer, Harrell Gave, PA-C  Candesartan Cilexetil-HCTZ 32-25 MG TABS Take 1 tablet by mouth daily. 04/03/18   [provider]  carvedilol (COREG) 6.25 MG tablet TK 1 T PO  BID 01/18/16   [provider]  citalopram (CELEXA) 10 MG tablet TAKE 1 TABLET(10 MG) BY MOUTH DAILY 08/29/18   Merian Capron, MD  DEXILANT 60 MG capsule TK ONE C PO BID 01/10/16   [provider]  diclofenac sodium (VOLTAREN) 1 %  GEL Apply 4 g topically 4 (four) times daily. Patient not taking: Reported on 05/29/2018 01/21/16   Dene Gentry, MD  Guaifenesin 1200 MG TB12 Take 1 tablet (1,200 mg total) by mouth 2 (two) times daily. 12/21/18   Lawyer, Harrell Gave, PA-C  hydrOXYzine (VISTARIL) 25 MG capsule Take 1 capsule (25 mg total) by mouth 2 (two) times daily as needed. 08/29/18 08/29/19  Merian Capron, MD  leflunomide (ARAVA) 20 MG tablet Take 1 tablet by mouth daily. 05/17/18   [provider]  losartan (COZAAR) 100 MG tablet  03/15/16   [provider]  OLUMIANT 2 MG TABS Take 2 mg by mouth daily. 05/09/18   [provider]  OMEPRAZOLE PO Take by mouth.    [provider]  oxyCODONE (ROXICODONE) 5 MG immediate release tablet Take 1 tablet (5 mg total) by  mouth every 4 (four) hours as needed for severe pain. 05/30/18   Aletha Halim, MD  predniSONE (DELTASONE) 50 MG tablet Take 1 tablet (50 mg total) by mouth daily. 12/21/18   Lawyer, Harrell Gave, PA-C  promethazine (PHENERGAN) 12.5 MG tablet Take 1-2 tablets (12.5-25 mg total) by mouth 2 (two) times daily as needed for nausea or vomiting. 08/29/18 08/29/19  Merian Capron, MD  promethazine-dextromethorphan (PROMETHAZINE-DM) 6.25-15 MG/5ML syrup Take 5 mLs by mouth 4 (four) times daily as needed for cough. 12/21/18   Lawyer, Harrell Gave, PA-C  sulfamethoxazole-trimethoprim (BACTRIM DS,SEPTRA DS) 800-160 MG tablet Take 1 tablet by mouth 2 (two) times daily. Patient not taking: Reported on 08/29/2018 05/31/18   Emily Filbert, MD  Suvorexant (BELSOMRA) 20 MG TABS Take 20 mg by mouth at bedtime. 08/29/18 08/29/19  Merian Capron, MD  tiZANidine (ZANAFLEX) 4 MG tablet Take 1 tablet (4 mg total) by mouth every 6 (six) hours as needed for muscle spasms. 03/31/16   Dene Gentry, MD  ziprasidone (GEODON) 60 MG capsule Take 1 capsule (60 mg total) by mouth 2 (two) times daily with a meal. 08/29/18 08/29/19  Merian Capron, MD  zolpidem Lorrin Mais) 10 MG tablet  01/14/16 08/29/18  [provider]    Family History Family History  Problem Relation Age of Onset  . Alcohol abuse Father   . Depression Father   . Depression Sister   . Alcohol abuse Sister     Social History Social History   Tobacco Use  . Smoking status: Never Smoker  . Smokeless tobacco: Never Used  Substance Use Topics  . Alcohol use: No    Alcohol/week: 0.0 standard drinks  . Drug use: No     Allergies   Hydrocodone, Amlodipine, Gabapentin, Norvasc [amlodipine besylate], Penicillins, Pramipexole, Pregabalin, and Ropinirole   Review of Systems Review of Systems  Constitutional: Negative for chills and fever.  HENT: Negative for ear pain and sore throat.   Eyes: Negative for pain and visual disturbance.  Respiratory: Negative for  cough and shortness of breath.   Cardiovascular: Negative for chest pain and palpitations.  Gastrointestinal: Positive for nausea and vomiting. Negative for abdominal pain, constipation and diarrhea.  Genitourinary: Positive for difficulty urinating, dysuria and urgency. Negative for flank pain and hematuria.  Musculoskeletal: Positive for back pain. Negative for arthralgias.  Skin: Positive for rash. Negative for color change.  Neurological: Negative for seizures and syncope.  All other systems reviewed and are negative.    Physical Exam Updated Vital Signs BP (!) 153/96 (BP Location: Right Arm)   Pulse 77   Temp 97.9 F (36.6 C) (Oral)   Resp 16  Ht 5\' 9"  (1.753 m)   Wt (!) 158.8 kg   SpO2 100%   BMI 51.69 kg/m   Physical Exam Vitals signs and nursing note reviewed.  Constitutional:      General: She is not in acute distress.    Appearance: She is well-developed. She is obese.     Comments: Non ill appearing.   HENT:     Head: Normocephalic and atraumatic.     Mouth/Throat:     Pharynx: No oropharyngeal exudate.  Eyes:     Pupils: Pupils are equal, round, and reactive to light.  Neck:     Musculoskeletal: Normal range of motion.  Cardiovascular:     Rate and Rhythm: Regular rhythm.     Heart sounds: Normal heart sounds.  Pulmonary:     Effort: Pulmonary effort is normal. No respiratory distress.     Breath sounds: Normal breath sounds.  Abdominal:     General: Bowel sounds are normal. There is no distension.     Palpations: Abdomen is soft.     Tenderness: There is no abdominal tenderness.  Musculoskeletal:        General: No tenderness or deformity.     Right lower leg: No edema.     Left lower leg: No edema.  Skin:    General: Skin is warm and dry.     Findings: Rash present. Rash is scaling.          Comments: Fungal rash noted below the abdominal pannus.   Neurological:     Mental Status: She is alert and oriented to person, place, and time.       ED Treatments / Results  Labs (all labs ordered are listed, but only abnormal results are displayed) Labs Reviewed  URINALYSIS, ROUTINE W REFLEX MICROSCOPIC - Abnormal; Notable for the following components:      Result Value   APPearance CLOUDY (*)    Leukocytes,Ua SMALL (*)    All other components within normal limits  COMPREHENSIVE METABOLIC PANEL - Abnormal; Notable for the following components:   Glucose, Bld 123 (*)    BUN 22 (*)    Creatinine, Ser 1.34 (*)    AST 13 (*)    GFR calc non Af Amer 45 (*)    GFR calc Af Amer 53 (*)    All other components within normal limits  URINALYSIS, MICROSCOPIC (REFLEX) - Abnormal; Notable for the following components:   Bacteria, UA MANY (*)    All other components within normal limits  URINE CULTURE  CBC WITH DIFFERENTIAL/PLATELET    EKG None  Radiology No results found.  Procedures Procedures (including critical care time)  Medications Ordered in ED Medications - No data to display   Initial Impression / Assessment and Plan / ED Course  I have reviewed the triage vital signs and the nursing notes.  Pertinent labs & imaging results that were available during my care of the patient were reviewed by me and considered in my medical decision making (see chart for details).      Presents to the ED with complaints of dysuria, reports taking medication about 3 weeks ago to help with her symptoms without improvement.  Urine evaluation patient is well-appearing, she also reports skin rash below her abdominal region along the pannus region.  This looks fungal in appearance.  UA showed many bacteria greater than 50,000 white blood cell count.  Specimen was not contaminated as squamous cells count is low.  CMP showed no electrolyte  abnormality, creatinine level is stable within patient's last visit.  CBC showed no leukocytosis, hemoglobin is within normal limits.  Small amount of leukocytes on UA as well.  Denies any back pain, fevers, low  suspicion for any [place] at this time.  Prescribed patient a short course of Macrobid, she is allergic to penicillin I will not be prescribing her Keflex.  In addition I will prescribe her cream to help with her fungal infection below her abdomen, nystatin. She is advised to follow-up with PCP as needed.  Patient at this time.  Return precautions provided at length.  Portions of this note were generated with [employer]. Dictation errors may occur despite best attempts at proofreading.    Final Clinical Impressions(s) / ED Diagnoses   Final diagnoses:  Acute cystitis without hematuria  Abdominal pannus  Fungal infection of skin    ED Discharge Orders    None       Janeece Fitting, Hershal Coria 06/23/19 1451    Jola Schmidt, MD 06/23/19 1523

## 2019-06-23 NOTE — ED Triage Notes (Signed)
Pt reports urinary retention, foul odor x 1 week. Also endorses vomiting.

## 2019-06-26 LAB — URINE CULTURE: Culture: >=100000 — AB

## 2019-06-27 ENCOUNTER — Telehealth: Payer: Self-pay | Admitting: Emergency Medicine

## 2019-06-27 NOTE — Telephone Encounter (Signed)
Post ED Visit - Positive Culture Follow-up  Culture report reviewed by antimicrobial stewardship pharmacist: [facility] Team []  Elenor Quinones, Pharm.D. []  Heide Guile, Pharm.D., BCPS AQ-ID []  Parks Neptune, Pharm.D., BCPS []  Alycia Rossetti, Pharm.D., BCPS []  [place], Pharm.D., BCPS, AAHIVP []  Legrand Como, Pharm.D., BCPS, AAHIVP [x]  Salome Arnt, PharmD, BCPS []  Johnnette Gourd, PharmD, BCPS []  Hughes Better, PharmD, BCPS []  Leeroy Cha, PharmD []  Laqueta Linden, PharmD, BCPS []  Albertina Parr, PharmD  [place] Team []  Leodis Sias, PharmD []  Lindell Spar, PharmD []  Royetta Asal, PharmD []  Graylin Shiver, Rph []  Rema Fendt) Glennon Mac, PharmD []  Arlyn Dunning, PharmD []  Netta Cedars, PharmD []  Dia Sitter, PharmD []  Leone Haven, PharmD []  Gretta Arab, PharmD []  Theodis Shove, PharmD []  Peggyann Juba, PharmD []  Reuel Boom, PharmD   Positive urine culture Treated with nitrofurantoin, organism sensitive to the same and no further patient follow-up is required at this time.  Hazle Nordmann 06/27/2019, 11:45 AM

## 2019-06-29 ENCOUNTER — Telehealth (HOSPITAL_COMMUNITY): Payer: Self-pay

## 2020-02-21 ENCOUNTER — Other Ambulatory Visit: Payer: Self-pay | Admitting: Gastroenterology

## 2020-03-03 ENCOUNTER — Encounter: Payer: Medicare HMO | Admitting: Neurology

## 2020-03-06 ENCOUNTER — Other Ambulatory Visit (HOSPITAL_COMMUNITY)
Admission: RE | Admit: 2020-03-06 | Discharge: 2020-03-06 | Disposition: A | Discharge status: Home / Self Care | Payer: Medicare HMO | Source: Ambulatory Visit | Attending: Gastroenterology | Admitting: Gastroenterology

## 2020-03-06 DIAGNOSIS — Z20822 Contact with and (suspected) exposure to covid-19: Secondary | ICD-10-CM | POA: Insufficient documentation

## 2020-03-06 DIAGNOSIS — Z01812 Encounter for preprocedural laboratory examination: Secondary | ICD-10-CM | POA: Diagnosis present

## 2020-03-06 LAB — SARS CORONAVIRUS 2 (TAT 6-24 HRS): SARS Coronavirus 2: NEGATIVE

## 2020-03-10 ENCOUNTER — Encounter (HOSPITAL_COMMUNITY): Payer: Self-pay | Admitting: Gastroenterology

## 2020-03-10 ENCOUNTER — Ambulatory Visit (HOSPITAL_COMMUNITY)
Admission: RE | Admit: 2020-03-10 | Discharge: 2020-03-10 | Disposition: A | Discharge status: Home / Self Care | Payer: Medicare HMO | Attending: Gastroenterology | Admitting: Gastroenterology

## 2020-03-10 ENCOUNTER — Other Ambulatory Visit: Payer: Self-pay | Admitting: Gastroenterology

## 2020-03-10 ENCOUNTER — Other Ambulatory Visit: Payer: Self-pay

## 2020-03-10 ENCOUNTER — Ambulatory Visit (HOSPITAL_COMMUNITY): Payer: Medicare HMO | Admitting: Certified Registered Nurse Anesthetist

## 2020-03-10 ENCOUNTER — Encounter (HOSPITAL_COMMUNITY)
Admission: RE | Disposition: A | Discharge status: Home / Self Care | Payer: Self-pay | Source: Home / Self Care | Attending: Gastroenterology

## 2020-03-10 DIAGNOSIS — Z833 Family history of diabetes mellitus: Secondary | ICD-10-CM | POA: Diagnosis not present

## 2020-03-10 DIAGNOSIS — Z6841 Body Mass Index (BMI) 40.0 and over, adult: Secondary | ICD-10-CM | POA: Diagnosis not present

## 2020-03-10 DIAGNOSIS — K449 Diaphragmatic hernia without obstruction or gangrene: Secondary | ICD-10-CM | POA: Diagnosis not present

## 2020-03-10 DIAGNOSIS — M1990 Unspecified osteoarthritis, unspecified site: Secondary | ICD-10-CM | POA: Diagnosis not present

## 2020-03-10 DIAGNOSIS — X58XXXA Exposure to other specified factors, initial encounter: Secondary | ICD-10-CM | POA: Diagnosis not present

## 2020-03-10 DIAGNOSIS — D122 Benign neoplasm of ascending colon: Secondary | ICD-10-CM | POA: Diagnosis not present

## 2020-03-10 DIAGNOSIS — Z885 Allergy status to narcotic agent status: Secondary | ICD-10-CM | POA: Insufficient documentation

## 2020-03-10 DIAGNOSIS — K2950 Unspecified chronic gastritis without bleeding: Secondary | ICD-10-CM | POA: Diagnosis not present

## 2020-03-10 DIAGNOSIS — E1122 Type 2 diabetes mellitus with diabetic chronic kidney disease: Secondary | ICD-10-CM | POA: Diagnosis not present

## 2020-03-10 DIAGNOSIS — N183 Chronic kidney disease, stage 3 unspecified: Secondary | ICD-10-CM | POA: Insufficient documentation

## 2020-03-10 DIAGNOSIS — K219 Gastro-esophageal reflux disease without esophagitis: Secondary | ICD-10-CM | POA: Diagnosis present

## 2020-03-10 DIAGNOSIS — I129 Hypertensive chronic kidney disease with stage 1 through stage 4 chronic kidney disease, or unspecified chronic kidney disease: Secondary | ICD-10-CM | POA: Diagnosis not present

## 2020-03-10 DIAGNOSIS — F419 Anxiety disorder, unspecified: Secondary | ICD-10-CM | POA: Diagnosis not present

## 2020-03-10 DIAGNOSIS — Z79899 Other long term (current) drug therapy: Secondary | ICD-10-CM | POA: Insufficient documentation

## 2020-03-10 DIAGNOSIS — G4733 Obstructive sleep apnea (adult) (pediatric): Secondary | ICD-10-CM | POA: Insufficient documentation

## 2020-03-10 DIAGNOSIS — K5730 Diverticulosis of large intestine without perforation or abscess without bleeding: Secondary | ICD-10-CM | POA: Insufficient documentation

## 2020-03-10 DIAGNOSIS — K621 Rectal polyp: Secondary | ICD-10-CM | POA: Insufficient documentation

## 2020-03-10 DIAGNOSIS — T182XXA Foreign body in stomach, initial encounter: Secondary | ICD-10-CM | POA: Diagnosis not present

## 2020-03-10 DIAGNOSIS — Z1211 Encounter for screening for malignant neoplasm of colon: Secondary | ICD-10-CM | POA: Diagnosis not present

## 2020-03-10 DIAGNOSIS — Z980 Intestinal bypass and anastomosis status: Secondary | ICD-10-CM | POA: Insufficient documentation

## 2020-03-10 HISTORY — PX: COLONOSCOPY WITH PROPOFOL: SHX5780

## 2020-03-10 HISTORY — PX: POLYPECTOMY: SHX5525

## 2020-03-10 HISTORY — PX: BIOPSY: SHX5522

## 2020-03-10 HISTORY — PX: ESOPHAGOGASTRODUODENOSCOPY (EGD) WITH PROPOFOL: SHX5813

## 2020-03-10 SURGERY — COLONOSCOPY WITH PROPOFOL
Anesthesia: Monitor Anesthesia Care

## 2020-03-10 MED ORDER — LACTATED RINGERS IV SOLN: INTRAVENOUS | Status: DC | PRN

## 2020-03-10 MED ORDER — LIDOCAINE 2% (20 MG/ML) 5 ML SYRINGE
INTRAMUSCULAR | Status: DC | PRN
  Administered 2020-03-10: 80 mg via INTRAVENOUS

## 2020-03-10 MED ORDER — SODIUM CHLORIDE 0.9 % IV SOLN: INTRAVENOUS | Status: DC

## 2020-03-10 MED ORDER — PROPOFOL 10 MG/ML IV BOLUS
INTRAVENOUS | Status: DC | PRN
  Administered 2020-03-10 (×2): 50 mg via INTRAVENOUS

## 2020-03-10 MED ORDER — GLYCOPYRROLATE PF 0.2 MG/ML IJ SOSY
PREFILLED_SYRINGE | INTRAMUSCULAR | Status: DC | PRN
  Administered 2020-03-10: .2 mg via INTRAVENOUS

## 2020-03-10 MED ORDER — PROPOFOL 500 MG/50ML IV EMUL
INTRAVENOUS | Status: DC | PRN
  Administered 2020-03-10: 150 ug/kg/min via INTRAVENOUS

## 2020-03-10 SURGICAL SUPPLY — 25 items

## 2020-03-10 NOTE — Anesthesia Postprocedure Evaluation (Signed)
Anesthesia Post Note  Patient: Kelly Valenzuela  Procedure(s) Performed: COLONOSCOPY WITH PROPOFOL (N/A ) ESOPHAGOGASTRODUODENOSCOPY (EGD) WITH PROPOFOL (N/A ) BIOPSY POLYPECTOMY     Patient location during evaluation: PACU Anesthesia Type: MAC Level of consciousness: awake and alert Pain management: pain level controlled Vital Signs Assessment: post-procedure vital signs reviewed and stable Respiratory status: spontaneous breathing, nonlabored ventilation, respiratory function stable and patient connected to nasal cannula oxygen Cardiovascular status: stable and blood pressure returned to baseline Postop Assessment: no apparent nausea or vomiting Anesthetic complications: no    Last Vitals:  Vitals:   03/10/20 1210 03/10/20 1220  BP: (!) 161/85 (!) 169/71  Pulse: 76 75  Resp: 20 18  Temp:    SpO2: 100% 97%    Last Pain:  Vitals:   03/10/20 1206  TempSrc: Oral  PainSc: 0-No pain                 [name]

## 2020-03-10 NOTE — Op Note (Signed)
[facility] Patient Name: Kelly Valenzuela Procedure Date: 03/10/2020 MRN: [id number] Attending MD: Ronnette Juniper , MD Date of Birth: Nov 22, 1966 CSN: [id number] Age: 54 Admit Type: Outpatient Procedure:                Upper GI endoscopy Indications:              Suspected gastro-esophageal reflux disease Providers:                Ronnette Juniper, MD, Burtis Junes, RN, Corie Chiquito,                            Technician, Caryl Pina CRNA Referring MD:             Self Referral Medicines:                Monitored Anesthesia Care Complications:            No immediate complications. Estimated Blood Loss:     Estimated blood loss: none. Procedure:                Pre-Anesthesia Assessment:                           - Prior to the procedure, a History and Physical                            was performed, and patient medications and                            allergies were reviewed. The patient's tolerance of                            previous anesthesia was also reviewed. The risks                            and benefits of the procedure and the sedation                            options and risks were discussed with the patient.                            All questions were answered, and informed consent                            was obtained. Prior Anticoagulants: The patient has                            taken no previous anticoagulant or antiplatelet                            agents. ASA Grade Assessment: III - A patient with                            severe systemic disease. After reviewing the risks  and benefits, the patient was deemed in                            satisfactory condition to undergo the procedure.                           After obtaining informed consent, the endoscope was                            passed under direct vision. Throughout the                            procedure, the patient's blood pressure, pulse, and                       oxygen saturations were monitored continuously. The                            [id number]) Olympus gastroscope was                            introduced through the mouth, and advanced to the                            second part of duodenum. The upper GI endoscopy was                            accomplished without difficulty. The patient                            tolerated the procedure well. Scope In: Scope Out: Findings:      The Z-line was regular and was found 36 cm from the incisors.      The examined esophagus was normal.      Evidence of a previous surgical anastomosis was found in the gastric       body. This was characterized by healthy appearing mucosa.      A large hiatal hernia was present. It almost appeared that patient had       two separate hernial sacs as the scope was advanced into the gastric       cavity.      A medium amount of a phytobezoar was found in the cardia and in the       gastric fundus.      Localized mildly erythematous mucosa without bleeding was found in the       gastric antrum. Biopsies were taken with a cold forceps for Helicobacter       pylori testing.      The examined duodenum was normal. Impression:               - Z-line regular, 36 cm from the incisors.                           - Normal esophagus.                           - A previous surgical anastomosis was found,  characterized by healthy appearing mucosa.                           - Large hiatal hernia.                           - A medium amount of a phytobezoar in the stomach.                           - Erythematous mucosa in the antrum. Biopsied.                           - Normal examined duodenum. Moderate Sedation:      Patient did not receive moderate sedation for this procedure, but       instead received monitored anesthesia care. Recommendation:           - Patient has a contact number available for                             emergencies. The signs and symptoms of potential                            delayed complications were discussed with the                            patient. Return to normal activities tomorrow.                            Written discharge instructions were provided to the                            patient.                           - Resume regular diet.                           - Continue present medications.                           - Await pathology results.                           - Aggressive anti reflux measures such as weight                            loss, elevate head end of the bed during sleep,                            avoid or limit caffeinated products and space last                            meal of the day and bedtime by at least 3 hours. Procedure Code(s):        --- Professional ---  [id number], Esophagogastroduodenoscopy, flexible,                            transoral; with biopsy, single or multiple Diagnosis Code(s):        --- Professional ---                           Z98.0, Intestinal bypass and anastomosis status                           K44.9, Diaphragmatic hernia without obstruction or                            gangrene                           T18.2XXA, Foreign body in stomach, initial encounter                           K31.89, Other diseases of stomach and duodenum CPT copyright 2019 [employer] Association. All rights reserved. The codes documented in this report are preliminary and upon coder review may  be revised to meet current compliance requirements. Ronnette Juniper, MD 03/10/2020 12:05:39 PM This report has been signed electronically. Number of Addenda: 0

## 2020-03-10 NOTE — Discharge Instructions (Signed)
YOU HAD AN ENDOSCOPIC PROCEDURE TODAY: Refer to the procedure report and other information in the discharge instructions given to you for any specific questions about what was found during the examination. If this information does not answer your questions, please call [facility] office at [phone] to clarify.   YOU SHOULD EXPECT: Some feelings of bloating in the abdomen. Passage of more gas than usual. Walking can help get rid of the air that was put into your GI tract during the procedure and reduce the bloating. If you had a lower endoscopy (such as a colonoscopy or flexible sigmoidoscopy) you may notice spotting of blood in your stool or on the toilet paper. Some abdominal soreness may be present for a day or two, also.  DIET: Your first meal following the procedure should be a light meal and then it is ok to progress to your normal diet. A half-sandwich or bowl of soup is an example of a good first meal. Heavy or fried foods are harder to digest and may make you feel nauseous or bloated. Drink plenty of fluids but you should avoid alcoholic beverages for 24 hours.    ACTIVITY: Your care partner should take you home directly after the procedure. You should plan to take it easy, moving slowly for the rest of the day. You can resume normal activity the day after the procedure however YOU SHOULD NOT DRIVE, use power tools, machinery or perform tasks that involve climbing or major physical exertion for 24 hours (because of the sedation medicines used during the test).   SYMPTOMS TO REPORT IMMEDIATELY: A gastroenterologist can be reached at any hour. Please call [phone]  for any of the following symptoms:  . Following lower endoscopy (colonoscopy, flexible sigmoidoscopy) Excessive amounts of blood in the stool  Significant tenderness, worsening of abdominal pains  Swelling of the abdomen that is new, acute  Fever of 100 or higher  . Following upper endoscopy (EGD, EUS, ERCP, esophageal  dilation) Vomiting of blood or coffee ground material  New, significant abdominal pain  New, significant chest pain or pain under the shoulder blades  Painful or persistently difficult swallowing  New shortness of breath  Black, tarry-looking or red, bloody stools  FOLLOW UP:  If any biopsies were taken you will be contacted by phone or by letter within the next 1-3 weeks. Call [phone]  if you have not heard about the biopsies in 3 weeks.  Please also call with any specific questions about appointments or follow up tests.  

## 2020-03-10 NOTE — Transfer of Care (Signed)
Immediate Anesthesia Transfer of Care Note  Patient: Kelly Valenzuela  Procedure(s) Performed: COLONOSCOPY WITH PROPOFOL (N/A ) ESOPHAGOGASTRODUODENOSCOPY (EGD) WITH PROPOFOL (N/A ) BIOPSY POLYPECTOMY  Patient Location: Endoscopy Unit  Anesthesia Type:MAC  Level of Consciousness: awake, alert , oriented and patient cooperative  Airway & Oxygen Therapy: Patient Spontanous Breathing and Patient connected to face mask oxygen  Post-op Assessment: Report given to RN, Post -op Vital signs reviewed and stable and Patient moving all extremities  Post vital signs: Reviewed and stable  Last Vitals:  Vitals Value Taken Time  BP 160/85 03/10/20 1208  Temp 36.6 C 03/10/20 1206  Pulse 76 03/10/20 1209  Resp 20 03/10/20 1209  SpO2 100 % 03/10/20 1209  Vitals shown include unvalidated device data.  Last Pain:  Vitals:   03/10/20 1206  TempSrc: Oral  PainSc: 0-No pain         Complications: No apparent anesthesia complications

## 2020-03-10 NOTE — H&P (Signed)
History of Present Illness  General:  This was an audio only visit that started at 3:02 pm until 3:12 pm. 53/female with history of obesity, BMI of 53, DM, HTN, OSA, stage 3 CKD, was referred for GERD. Labs on EPIC from 06/2019 showed renal impariment, otherwise normal LFTs and HB/PLT/MCV. Her last colonoscopy was over 10 years ago. At night, she has to use propped up pillows, keeps her head end elevated but has severe acid reflux, she feels "hot" in her throat and chest, even her nose hurts.She has been symptomatic for at least 5 years but is progressively worsening. She is on dexilant 60 mg a day and famotidine 40 mg at night. She has had an EGD over 10 years. She feels that her throat remains irritated at all times, some times food feels that it wants to come back up, it occurs with both solids and liquids. She is on lizesss 145 mcg po daily for the past 4-5 years,she has a BM in 2-3 days, denies blood in stool or black stools but notices small "tinge" of blood after straining. There is no family history of colon cancer, her parents had "large polyps removed". Denies abdominal or rectal pain. She has pain in RLQ, twice a week, intermittent, no worsening or relieving factors. She denies drinking tea/coffee, stopped drinking sodas, she mostly drinks apple juice or water. She doesn't smoke or drink alcohol. Last meal of the day is around 9 pm and goes to bed around 2 am. She has been drinking vegetable and fruits smoothies at night.   Current Medications  Taking   Ambien(Zolpidem Tartrate) 10 MG Tablet 1 tablet at bedtime as needed Orally Once a day   Benztropine Mesylate 1 MG Tablet 1 tablet at bedtime Orally Once a day   Candesartan Cilexetil-HCTZ 32-25 MG Tablet 1 tablet Orally Once a day   Carvedilol 6.25 MG Tablet 1 tablet with food Orally Twice a day   Dexilant(Dexlansoprazole) 60 MG Capsule Delayed Release 1 capsule Orally Once a day   HydrOXYzine HCl 50 MG Tablet 1 tablet as needed  Orally every 6 hrs   Invega Sustenna(Paliperidone Palmitate ER) 156 MG/ML Suspension Prefilled Syringe 1 ml Intramuscular   Oxycodone HCl 15 MG Tablet 1 tablet Orally bid   Pepcid(Famotidine) 40 MG Tablet 1 tablet at bedtime Orally Once a day   Prazosin HCl 5 MG Capsule 1 capsule at bedtime Orally Once a day   Promethazine HCl 25 MG Tablet 1 tablet Orally four times a day as needed for vomiting   Tizanidine HCl 4 MG Tablet 1 tablet as needed Orally Three times a day   Xanax(ALPRAZolam) 2 MG Tablet 1 tablet Orally Twice a day   Linzess 145 mcg . Capsule 1 capsule orally once a day with first meal   Medication List reviewed and reconciled with the patient    Past Medical History  BRUISED [place] IN BACK.   RA.   Anxiety/ paranoia.    Surgical History  stomach reconstructed x2   hysterectomy 1998  tonsillectomy/ adnoidectomy    Family History  Father: deceased, polyps, diagnosed with Diabetes  Mother: alive, Polyps  No Family History of Colon Cancer, = or Liver Disease.   Social History  General:  Tobacco use  cigarettes: Never smoked Tobacco history last updated 02/20/2020   Allergies  Norvasc: swell  Hydrocodone: headache   Hospitalization/Major Diagnostic Procedure  none in the last yr 02/2020   Review of Systems  GI PROCEDURE:  no Pacemaker/ AICD, no.  no Artificial heart valves. no MI/heart attack. no Abnormal heart rhythm. no Angina. no CVA. Hypertension YES. no Hypotension. no Asthma, COPD. no Sleep apnea. no Seizure disorders. no Artificial joints. Severe DJD YES. no Diabetes. no Significant headaches. no Vertigo. Depression/anxiety YES. no Abnormal bleeding. no Kidney Disease. no Liver disease, no. no Chance of pregnancy. no Blood transfusion. no Method of Birth Control. no Birth control pills.       Vital Signs  Wt 366, Ht 69, BMI 54.04 Pt states her wt is 366 lbs. ST,MA.   Examination  Gastroenterology:: RESPIRATORY able to speak in full sentences, not in  respiratory distress.  NEURO: oriented to time,place and person.  This was an audio only visit.    Assessments     1. Heartburn - R12 (Primary)   2. Gastroesophageal reflux disease, unspecified whether esophagitis present - K21.9   3. Screen for colon cancer - Z12.11   4. Morbid (severe) obesity due to excess calories - E66.01   5. Body mass index [BMI] 50.0-59.9, adult - Z68.43   Treatment  1. Heartburn  IMAGING: Esophagoscopy    Powell,Amy 02/21/2020 12:24:31 PM > Pt scheduled colon/egd at [facility] 3/22 - mailed prep instructions, rx, consents   Notes: Discussed with the patient that it is important to have certain lifestyle changes to reduce acid reflux such as weight loss, spacing last meal of the day and bedtime at least 3 hours and sleeping with head and elevated. Patient is currently on dexilant in the morning and famotidine in the evening, however continues to remain symptomatic.  Will proceed with a diagnostic endoscopy with evaluation of esophagus, stomach and small bowel.  .    2. Gastroesophageal reflux disease, unspecified whether esophagitis present  IMAGING: Esophagoscopy    Powell,Amy 02/21/2020 12:24:31 PM > Pt scheduled colon[facility]egd at [facility] 3/22 - mailed prep instructions, rx, consents   Notes: Discussed with the patient that it is important to have certain lifestyle changes to reduce acid reflux such as weight loss, spacing last meal of the day and bedtime at least 3 hours and sleeping with head and elevated. Patient is currently on dexilant in the morning and famotidine in the evening, however continues to remain symptomatic.  Will proceed with a diagnostic endoscopy with evaluation of esophagus, stomach and small bowel.  .    3. Screen for colon cancer  IMAGING: Colonoscopy    Powell,Amy 02/21/2020 12:24:31 PM > Pt scheduled colon/egd at [facility] 3/22 - mailed prep instructions, rx, consents   Notes: The risks and benefits of the procedure were discussed with the patient in  details.  She understands and verbalizes consent.  She will be given written instructions, prescription for preparation and will be scheduled for the same.    4. Morbid (severe) obesity due to excess calories  Notes: Due to BMI of more than 50, EGD and colonoscopy will be done as an outpatient at [facility].     Ronnette Juniper, MD

## 2020-03-10 NOTE — Op Note (Signed)
[facility] Patient Name: Kelly Valenzuela Procedure Date: 03/10/2020 MRN: [id number] Attending MD: Ronnette Juniper , MD Date of Birth: Oct 23, 1966 CSN: [id number] Age: 54 Admit Type: Outpatient Procedure:                Colonoscopy Indications:              Screening for colorectal malignant neoplasm, This                            is the patient's first colonoscopy Providers:                Ronnette Juniper, MD, Burtis Junes, RN, Corie Chiquito,                            Technician, Caryl Pina CRNA Referring MD:             Self referral Medicines:                Monitored Anesthesia Care Complications:            No immediate complications. Estimated Blood Loss:     Estimated blood loss: none. Procedure:                Pre-Anesthesia Assessment:                           - Prior to the procedure, a History and Physical                            was performed, and patient medications and                            allergies were reviewed. The patient's tolerance of                            previous anesthesia was also reviewed. The risks                            and benefits of the procedure and the sedation                            options and risks were discussed with the patient.                            All questions were answered, and informed consent                            was obtained. Prior Anticoagulants: The patient has                            taken no previous anticoagulant or antiplatelet                            agents. ASA Grade Assessment: III - A patient with  severe systemic disease. After reviewing the risks                            and benefits, the patient was deemed in                            satisfactory condition to undergo the procedure.                           After obtaining informed consent, the colonoscope                            was passed under direct vision. Throughout the        procedure, the patient's blood pressure, pulse, and                            oxygen saturations were monitored continuously. The                            [id number] Olympus pediatric colonscope                            was introduced through the anus and advanced to the                            the terminal ileum. The colonoscopy was performed                            without difficulty. The patient tolerated the                            procedure well. The quality of the bowel                            preparation was fair. Scope In: 11:40:07 AM Scope Out: 11:59:09 AM Scope Withdrawal Time: 0 hours 11 minutes 59 seconds  Total Procedure Duration: 0 hours 19 minutes 2 seconds  Findings:      The perianal and digital rectal examinations were normal.      The terminal ileum appeared normal.      A 4 mm polyp was found in the ascending colon. The polyp was sessile.       The polyp was removed with a piecemeal technique using a cold biopsy       forceps. Resection and retrieval were complete.      A 6 mm polyp was found in the rectum. The polyp was sessile. The polyp       was removed with a piecemeal technique using a cold biopsy forceps.       Resection and retrieval were complete.      A few small-mouthed diverticula were found in the sigmoid colon and       descending colon.      The exam was otherwise without abnormality on direct and retroflexion       views. Impression:               - Preparation of the  colon was fair.                           - The examined portion of the ileum was normal.                           - One 4 mm polyp in the ascending colon, removed                            piecemeal using a cold biopsy forceps. Resected and                            retrieved.                           - One 6 mm polyp in the rectum, removed piecemeal                            using a cold biopsy forceps. Resected and retrieved.                            - Diverticulosis in the sigmoid colon and in the                            descending colon.                           - The examination was otherwise normal on direct                            and retroflexion views. Moderate Sedation:      Patient did not receive moderate sedation for this procedure, but       instead received monitored anesthesia care. Recommendation:           - Patient has a contact number available for                            emergencies. The signs and symptoms of potential                            delayed complications were discussed with the                            patient. Return to normal activities tomorrow.                            Written discharge instructions were provided to the                            patient.                           - Resume regular diet.                           -  Continue present medications.                           - Await pathology results.                           - Repeat colonoscopy for surveillance after                            piecemeal polypectomy. Procedure Code(s):        --- Professional ---                           [phone], Colonoscopy, flexible; with biopsy, single                            or multiple Diagnosis Code(s):        --- Professional ---                           Z12.11, Encounter for screening for malignant                            neoplasm of colon                           K63.5, Polyp of colon                           K62.1, Rectal polyp                           K57.30, Diverticulosis of large intestine without                            perforation or abscess without bleeding CPT copyright 2019 American Medical Association. All rights reserved. The codes documented in this report are preliminary and upon coder review may  be revised to meet current compliance requirements. Ronnette Juniper, MD 03/10/2020 12:08:12 PM This report has been signed electronically. Number of  Addenda: 0

## 2020-03-10 NOTE — Anesthesia Preprocedure Evaluation (Addendum)
Anesthesia Evaluation  Patient identified by MRN, date of birth, ID band Patient awake    Reviewed: Allergy & Precautions, NPO status , Patient's Chart, lab work & pertinent test results, reviewed documented beta blocker date and time   Airway Mallampati: I  TM Distance: >3 FB Neck ROM: Full    Dental  (+) Edentulous Upper, Dental Advisory Given, Poor Dentition   Pulmonary sleep apnea ,    breath sounds clear to auscultation       Cardiovascular hypertension, Pt. on home beta blockers and Pt. on medications  Rhythm:Regular Rate:Normal     Neuro/Psych    GI/Hepatic GERD  Medicated and Controlled,  Endo/Other  diabetes  Renal/GU      Musculoskeletal  (+) Arthritis ,   Abdominal (+) + obese,   Peds  Hematology   Anesthesia Other Findings   Reproductive/Obstetrics                             Anesthesia Physical Anesthesia Plan  ASA: III  Anesthesia Plan: MAC   Post-op Pain Management:    Induction: Intravenous  PONV Risk Score and Plan: 0 and Propofol infusion  Airway Management Planned: Simple Face Mask and Nasal Cannula  Additional Equipment: None  Intra-op Plan:   Post-operative Plan:   Informed Consent:   Plan Discussed with: CRNA  Anesthesia Plan Comments:        Anesthesia Quick Evaluation

## 2020-03-10 NOTE — Brief Op Note (Signed)
03/10/2020  12:08 PM  PATIENT:  Kelly Valenzuela  54 y.o. female  PRE-OPERATIVE DIAGNOSIS:  Heartburn - R12, Gastroesophageal reflux disease and screening colon  POST-OPERATIVE DIAGNOSIS:  hiatal hernia, gastritis, rectal polyp  PROCEDURE:  Procedure(s): COLONOSCOPY WITH PROPOFOL (N/A) ESOPHAGOGASTRODUODENOSCOPY (EGD) WITH PROPOFOL (N/A) BIOPSY POLYPECTOMY  SURGEON:  Surgeon(s) and Role:    Ronnette Juniper, MD - Primary  PHYSICIAN ASSISTANT:   ASSISTANTS: Merlene Pulling   ANESTHESIA:   MAC  EBL:  Minimal  BLOOD ADMINISTERED:none  DRAINS: none   LOCAL MEDICATIONS USED:  NONE  SPECIMEN:  Biopsy / Limited Resection  DISPOSITION OF SPECIMEN:  PATHOLOGY  COUNTS:  YES  TOURNIQUET:  * No tourniquets in log *  DICTATION: .Dragon Dictation  PLAN OF CARE: Discharge to home after PACU  PATIENT DISPOSITION:  PACU - hemodynamically stable.   Delay start of Pharmacological VTE agent (>24hrs) due to surgical blood loss or risk of bleeding: no

## 2020-03-10 NOTE — Interval H&P Note (Signed)
History and Physical Interval Note: 53/female with GERD for EGD and colonoscopy for screening.  03/10/2020 11:01 AM  Rosh[place]  has presented today for EGD and colonoscopy, with the diagnosis of Heartburn - R12, Gastroesophageal reflux disease and screening colon.  The various methods of treatment have been discussed with the patient and family. After consideration of risks, benefits and other options for treatment, the patient has consented to  Procedure(s): COLONOSCOPY WITH PROPOFOL (N/A) ESOPHAGOGASTRODUODENOSCOPY (EGD) WITH PROPOFOL (N/A) as a surgical intervention.  The patient's history has been reviewed, patient examined, no change in status, stable for surgery.  I have reviewed the patient's chart and labs.  Questions were answered to the patient's satisfaction.     Ronnette Juniper

## 2020-03-11 ENCOUNTER — Encounter: Payer: Self-pay | Admitting: *Deleted

## 2020-03-11 ENCOUNTER — Other Ambulatory Visit: Payer: Self-pay

## 2020-03-11 LAB — SURGICAL PATHOLOGY

## 2020-03-18 ENCOUNTER — Ambulatory Visit
Admission: RE | Admit: 2020-03-18 | Discharge: 2020-03-18 | Disposition: A | Discharge status: Home / Self Care | Payer: Medicare HMO | Source: Ambulatory Visit | Attending: Gastroenterology | Admitting: Gastroenterology

## 2020-03-18 DIAGNOSIS — K449 Diaphragmatic hernia without obstruction or gangrene: Secondary | ICD-10-CM

## 2020-04-17 ENCOUNTER — Other Ambulatory Visit (HOSPITAL_COMMUNITY): Payer: Self-pay | Admitting: Gastroenterology

## 2020-04-17 ENCOUNTER — Other Ambulatory Visit: Payer: Self-pay | Admitting: Gastroenterology

## 2020-04-17 DIAGNOSIS — R112 Nausea with vomiting, unspecified: Secondary | ICD-10-CM

## 2020-04-17 DIAGNOSIS — K219 Gastro-esophageal reflux disease without esophagitis: Secondary | ICD-10-CM

## 2020-04-17 DIAGNOSIS — R12 Heartburn: Secondary | ICD-10-CM

## 2020-05-07 ENCOUNTER — Other Ambulatory Visit: Payer: Self-pay

## 2020-05-07 ENCOUNTER — Ambulatory Visit (HOSPITAL_COMMUNITY)
Admission: RE | Admit: 2020-05-07 | Discharge: 2020-05-07 | Disposition: A | Discharge status: Home / Self Care | Payer: Medicare HMO | Source: Ambulatory Visit | Attending: Gastroenterology | Admitting: Gastroenterology

## 2020-05-07 DIAGNOSIS — R112 Nausea with vomiting, unspecified: Secondary | ICD-10-CM

## 2020-05-07 DIAGNOSIS — K219 Gastro-esophageal reflux disease without esophagitis: Secondary | ICD-10-CM | POA: Insufficient documentation

## 2020-05-07 DIAGNOSIS — R12 Heartburn: Secondary | ICD-10-CM | POA: Insufficient documentation

## 2020-05-07 MED ORDER — TECHNETIUM TC 99M SULFUR COLLOID
1.8700 | Freq: Once | INTRAVENOUS | Status: AC | PRN
  Administered 2020-05-07: 1.87 via INTRAVENOUS

## 2020-05-12 ENCOUNTER — Emergency Department (HOSPITAL_BASED_OUTPATIENT_CLINIC_OR_DEPARTMENT_OTHER): Payer: Medicare HMO

## 2020-05-12 ENCOUNTER — Emergency Department (HOSPITAL_BASED_OUTPATIENT_CLINIC_OR_DEPARTMENT_OTHER)
Admission: EM | Admit: 2020-05-12 | Discharge: 2020-05-12 | Disposition: A | Discharge status: Home / Self Care | Payer: Medicare HMO | Attending: Emergency Medicine | Admitting: Emergency Medicine

## 2020-05-12 ENCOUNTER — Encounter (HOSPITAL_BASED_OUTPATIENT_CLINIC_OR_DEPARTMENT_OTHER): Payer: Self-pay | Admitting: *Deleted

## 2020-05-12 ENCOUNTER — Other Ambulatory Visit: Payer: Self-pay

## 2020-05-12 DIAGNOSIS — Z79899 Other long term (current) drug therapy: Secondary | ICD-10-CM | POA: Insufficient documentation

## 2020-05-12 DIAGNOSIS — M79604 Pain in right leg: Secondary | ICD-10-CM | POA: Diagnosis not present

## 2020-05-12 DIAGNOSIS — Z886 Allergy status to analgesic agent status: Secondary | ICD-10-CM | POA: Diagnosis not present

## 2020-05-12 DIAGNOSIS — Z888 Allergy status to other drugs, medicaments and biological substances status: Secondary | ICD-10-CM | POA: Insufficient documentation

## 2020-05-12 DIAGNOSIS — Z880 Allergy status to penicillin: Secondary | ICD-10-CM | POA: Diagnosis not present

## 2020-05-12 DIAGNOSIS — M79671 Pain in right foot: Secondary | ICD-10-CM | POA: Insufficient documentation

## 2020-05-12 DIAGNOSIS — I10 Essential (primary) hypertension: Secondary | ICD-10-CM | POA: Insufficient documentation

## 2020-05-12 DIAGNOSIS — Z885 Allergy status to narcotic agent status: Secondary | ICD-10-CM | POA: Insufficient documentation

## 2020-05-12 DIAGNOSIS — E785 Hyperlipidemia, unspecified: Secondary | ICD-10-CM | POA: Insufficient documentation

## 2020-05-12 MED ORDER — OXYCODONE-ACETAMINOPHEN 5-325 MG PO TABS
1.0000 | ORAL_TABLET | Freq: Once | ORAL | Status: AC
  Administered 2020-05-12: 1 via ORAL
  Filled 2020-05-12: qty 1

## 2020-05-12 NOTE — Discharge Instructions (Signed)
Your work-up is reassuring.  No signs of a fracture.  No blood clot.  Continue to ice and elevate the area.  Use the Ace wrap for compression.  Please follow-up with the orthopedic doctor if your symptoms not improving return the ER with any worsening symptoms.

## 2020-05-12 NOTE — ED Provider Notes (Signed)
[facility] DEPARTMENT Provider Note   CSN: [id number] Arrival date & time: 05/12/20  1413     History Chief Complaint  Patient presents with  . Leg Injury    Kelly Valenzuela is a 54 y.o. female.  HPI 54 year old African-American female past medical history significant for hypertension, RA who presents the ER for evaluation of right leg injury.  Patient reports 1 week ago she was going down some stairs and slipped causing her to twist and hit her right ankle and foot.  Patient reports persistent pain and swelling.  Patient has been able to ambulate however this does cause some pain.  Patient reports bruising as well.  Pain radiates from the right medial malleolus to the calf.  Denies any numbness or tingling.  She is taken no medications for pain prior to arrival.  Patient reports the pain is a 10/10.  No history of DVT.    Past Medical History:  Diagnosis Date  . Anxiety   . Hypertension   . Rheumatoid arthritis ([place])   . Sebaceous hyperplasia of vulva     Patient Active Problem List   Diagnosis Date Noted  . Arthritis of knee, left 03/12/2016  . Left knee pain 01/26/2016  . Metatarsal deformity 12/18/2014  . Equinus deformity of foot, acquired 12/18/2014  . Pronation deformity of ankle, acquired 12/18/2014  . Metatarsalgia of both feet 12/18/2014  . H/O: hysterectomy 11/21/2014  . HLD (hyperlipidemia) 07/26/2014  . Hyperinsulinemia 07/26/2014  . Obesity, diabetes, and hypertension syndrome ([place]) 07/24/2014  . Body mass index (BMI) of 50-59.9 in adult ([place]) 06/05/2014  . Anxiety 06/05/2014  . BP (high blood pressure) 06/05/2014  . Apnea, sleep 06/05/2014  . History of surgical procedure 06/05/2014  . Bipolar disorder, current episode mixed, moderate ([place]) 03/18/2014  . 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 Degeneration of intervertebral disc of lumbosacral region 02/01/2014  . Acid reflux 05/26/2013    Past Surgical History:  Procedure Laterality Date    . ABDOMINAL HYSTERECTOMY    . BIOPSY  03/10/2020   Procedure: BIOPSY;  Surgeon: Ronnette Juniper, MD;  Location: [facility];  Service: Gastroenterology;;  . COLONOSCOPY WITH PROPOFOL N/A 03/10/2020   Procedure: COLONOSCOPY WITH PROPOFOL;  Surgeon: Ronnette Juniper, MD;  Location: [facility];  Service: Gastroenterology;  Laterality: N/A;  . ESOPHAGOGASTRODUODENOSCOPY (EGD) WITH PROPOFOL N/A 03/10/2020   Procedure: ESOPHAGOGASTRODUODENOSCOPY (EGD) WITH PROPOFOL;  Surgeon: Ronnette Juniper, MD;  Location: [facility];  Service: Gastroenterology;  Laterality: N/A;  . POLYPECTOMY  03/10/2020   Procedure: POLYPECTOMY;  Surgeon: Ronnette Juniper, MD;  Location: [facility];  Service: Gastroenterology;;  . STOMACH SURGERY    . VAGINA RECONSTRUCTION SURGERY       OB History    Gravida  2   Para  2   Term  2   Preterm      AB      Living        SAB      TAB      Ectopic      Multiple      Live Births              Family History  Problem Relation Age of Onset  . Alcohol abuse Father   . Depression Father   . Depression Sister   . Alcohol abuse Sister     Social History   Tobacco Use  . Smoking status: Never Smoker  . Smokeless tobacco: Never Used  Substance Use Topics  . Alcohol  use: No    Alcohol/week: 0.0 standard drinks  . Drug use: No    Home Medications Prior to Admission medications   Medication Sig Start Date End Date Taking? Authorizing Provider  acetaminophen (TYLENOL) 500 MG tablet Take 1,000 mg by mouth every 6 (six) hours as needed for moderate pain or headache.    [provider]  alprazolam Duanne Moron) 2 MG tablet Take 2 mg by mouth in the morning, at noon, in the evening, and at bedtime.    [provider]  benztropine (COGENTIN) 1 MG tablet Take 1 mg by mouth at bedtime.    [provider]  Candesartan Cilexetil-HCTZ 32-25 MG TABS Take 1 tablet by mouth daily. 04/03/18   [provider]  carvedilol (COREG) 6.25 MG tablet Take  6.25 mg by mouth in the morning and at bedtime.  01/18/16   [provider]  cholecalciferol (VITAMIN D3) 25 MCG (1000 UNIT) tablet Take 1,000 Units by mouth 2 (two) times a week.    [provider]  DEXILANT 60 MG capsule Take 60 mg by mouth daily.  01/10/16   [provider]  famotidine (PEPCID) 40 MG tablet Take 40 mg by mouth at bedtime.    [provider]  hydrOXYzine (VISTARIL) 50 MG capsule Take 50 mg by mouth 4 (four) times daily. 02/22/20   [provider]  INVEGA SUSTENNA 156 MG/ML SUSY injection Inject 156 mg into the muscle every 30 (thirty) days. 02/20/20   [provider]  lamoTRIgine (LAMICTAL) 150 MG tablet Take 150 mg by mouth at bedtime.    [provider]  Lemborexant (DAYVIGO) 10 MG TABS Take 10 mg by mouth at bedtime.    [provider]  nystatin ointment (MYCOSTATIN) Apply 1 application topically 2 (two) times daily as needed for rash. 02/27/20   [provider]  oxyCODONE (ROXICODONE) 15 MG immediate release tablet Take 15 mg by mouth in the morning and at bedtime.    [provider]  prazosin (MINIPRESS) 5 MG capsule Take 5 mg by mouth at bedtime.    [provider]  promethazine (PHENERGAN) 25 MG tablet Take 25 mg by mouth in the morning and at bedtime.    [provider]  RINVOQ 15 MG TB24 Take 15 mg by mouth daily. 02/23/20   [provider]  tiZANidine (ZANAFLEX) 4 MG tablet Take 1 tablet (4 mg total) by mouth every 6 (six) hours as needed for muscle spasms. Patient taking differently: Take 4 mg by mouth 3 (three) times daily as needed for muscle spasms.  03/31/16   Dene Gentry, MD  zolpidem (AMBIEN) 10 MG tablet  01/14/16 08/29/18  [provider]    Allergies    Hydrocodone, Gabapentin, Norvasc [amlodipine besylate], Penicillins, Pramipexole, Pregabalin, and Ropinirole  Review of Systems   Review of Systems  Constitutional: Negative for chills and  fever.  HENT: Negative for congestion.   Eyes: Negative for discharge.  Musculoskeletal: Positive for arthralgias and myalgias.  Skin: Positive for color change. Negative for wound.  Neurological: Negative for weakness and numbness.  Psychiatric/Behavioral: Negative for confusion.    Physical Exam Updated Vital Signs BP (!) 159/89 (BP Location: Right Arm)   Pulse 74   Temp 99.1 F (37.3 C)   Resp 20   Ht 5\' 9"  (1.753 m)   Wt (!) 158.8 kg   SpO2 99%   BMI 51.69 kg/m   Physical Exam Vitals and nursing note reviewed.  Constitutional:  General: She is not in acute distress.    Appearance: She is well-developed.  HENT:     Head: Normocephalic and atraumatic.  Eyes:     General: No scleral icterus.       Right eye: No discharge.        Left eye: No discharge.  Cardiovascular:     Pulses: Normal pulses.  Pulmonary:     Effort: No respiratory distress.  Musculoskeletal:        General: Normal range of motion.     Cervical back: Normal range of motion.     Comments: Patient does have some swelling to the right medial malleolus, right midfoot and right calf.  Mild ecchymosis noted.  Skin compartments are soft.  DP pulses are 2+.  Capillary refill is normal.  Sensation is intact.  Patient has full range of motion of the right lower extremity.  Skin:    General: Skin is warm and dry.     Capillary Refill: Capillary refill takes less than 2 seconds.     Coloration: Skin is not pale.  Neurological:     Mental Status: She is alert.     Sensory: No sensory deficit.     Motor: No weakness.  Psychiatric:        Behavior: Behavior normal.        Thought Content: Thought content normal.        Judgment: Judgment normal.     ED Results / Procedures / Treatments   Labs (all labs ordered are listed, but only abnormal results are displayed) Labs Reviewed - No data to display  EKG None  Radiology DG Tibia/Fibula Right  Result Date: 05/12/2020 CLINICAL DATA:  Fall 1 week  ago with right lower leg pain and swelling. EXAM: RIGHT TIBIA AND FIBULA - 2 VIEW COMPARISON:  None. FINDINGS: No fracture is identified. The knee is located with mild marginal osteophytosis noted in the lateral compartment. There are small to moderate-sized posterior and plantar calcaneal enthesophytes. Soft tissue swelling is noted about the ankle. IMPRESSION: No acute osseous abnormality identified. Electronically Signed   By: Logan Bores M.D.   On: 05/12/2020 15:05   DG Ankle Complete Right  Result Date: 05/12/2020 CLINICAL DATA:  Fall 1 week ago with right lower leg pain and swelling. EXAM: RIGHT ANKLE - COMPLETE 3+ VIEW COMPARISON:  None. FINDINGS: No acute fracture or dislocation is identified. Small to moderate-sized plantar and posterior calcaneal enthesophytes are noted. There is soft tissue swelling about the ankle. The foot is reported separately. IMPRESSION: Soft tissue swelling without acute osseous abnormality identified. Electronically Signed   By: Logan Bores M.D.   On: 05/12/2020 15:11   US Venous Img Lower Unilateral Right  Result Date: 05/12/2020 CLINICAL DATA:  Right leg edema, right ankle swelling EXAM: RIGHT LOWER EXTREMITY VENOUS DOPPLER ULTRASOUND TECHNIQUE: Gray-scale sonography with compression, as well as color and duplex ultrasound, were performed to evaluate the deep venous system(s) from the level of the common femoral vein through the popliteal and proximal calf veins. COMPARISON:  None. FINDINGS: VENOUS Normal compressibility of the common femoral, superficial femoral, and popliteal veins, as well as the visualized calf veins. Visualized portions of profunda femoral vein and great saphenous vein unremarkable. No filling defects to suggest DVT on grayscale or color Doppler imaging. Doppler waveforms show normal direction of venous flow, normal respiratory plasticity and response to augmentation. Limited views of the contralateral common femoral vein are unremarkable. OTHER  None. Limitations: none IMPRESSION: Negative.  Electronically Signed   By: Randa Ngo M.D.   On: 05/12/2020 17:39   DG Foot Complete Right  Result Date: 05/12/2020 CLINICAL DATA:  Fall 1 week ago with right lower leg pain and swelling. EXAM: RIGHT FOOT COMPLETE - 3+ VIEW COMPARISON:  12/12/2014 FINDINGS: No acute fracture or dislocation is identified. Chronic, benign-appearing periosteal reaction along the shafts of the second through fourth metatarsals is unchanged. There is an accessory navicular bone with associated spurring, and there are also small to moderate-sized plantar and posterior calcaneal enthesophytes. Moderate soft tissue swelling is noted in the forefoot. IMPRESSION: Soft tissue swelling without acute osseous abnormality identified. Electronically Signed   By: Logan Bores M.D.   On: 05/12/2020 15:04    Procedures Procedures (including critical care time)  Medications Ordered in ED Medications  oxyCODONE-acetaminophen (PERCOCET/ROXICET) 5-325 MG per tablet 1 tablet (1 tablet Oral Given 05/12/20 1455)    ED Course  I have reviewed the triage vital signs and the nursing notes.  Pertinent labs & imaging results that were available during my care of the patient were reviewed by me and considered in my medical decision making (see chart for details).    MDM Rules/Calculators/A&P                      Patient X-Ray negative for obvious fracture or dislocation.  No signs of DVT.  Pain managed in ED. Pt advised to follow up with orthopedics if symptoms persist for possibility of missed fracture diagnosis. Patient given brace while in ED, conservative therapy recommended and discussed. Patient will be dc home & is agreeable with above plan.  Final Clinical Impression(s) / ED Diagnoses Final diagnoses:  Right foot pain    Rx / DC Orders ED Discharge Orders    None       Aaron Edelman 05/12/20 1759    Blanchie Dessert, MD 05/13/20 2046

## 2020-05-12 NOTE — ED Notes (Signed)
ED Provider at bedside. 

## 2020-05-12 NOTE — ED Triage Notes (Signed)
Pt c/o fall with right lower leg pain and swelling x  1 week

## 2020-08-07 ENCOUNTER — Encounter: Payer: Self-pay | Admitting: Family Medicine

## 2020-08-07 ENCOUNTER — Ambulatory Visit (INDEPENDENT_AMBULATORY_CARE_PROVIDER_SITE_OTHER): Payer: Medicare HMO | Admitting: Family Medicine

## 2020-08-07 ENCOUNTER — Ambulatory Visit: Payer: Self-pay

## 2020-08-07 ENCOUNTER — Other Ambulatory Visit: Payer: Self-pay

## 2020-08-07 VITALS — BP 123/81 | HR 86 | Ht 69.0 in | Wt 365.0 lb

## 2020-08-07 DIAGNOSIS — M1712 Unilateral primary osteoarthritis, left knee: Secondary | ICD-10-CM | POA: Diagnosis not present

## 2020-08-07 MED ORDER — TRIAMCINOLONE ACETONIDE 40 MG/ML IJ SUSP
40.0000 mg | Freq: Once | INTRAMUSCULAR | Status: AC
  Administered 2020-08-07: 40 mg via INTRA_ARTICULAR

## 2020-08-07 NOTE — Progress Notes (Signed)
Kelly Valenzuela - 54 y.o. female MRN [id number]  Date of birth: 04-17-1966  SUBJECTIVE:  Including CC & ROS.  Chief Complaint  Patient presents with  . Knee Injury    left x May 2021    Kelly Valenzuela is a 54 y.o. female that is presenting with left knee pain.  This is acute on chronic in nature.  This is anterior and over the medial aspect.  She did have a fall back in May which seem to exacerbate it.  Last time she had an injection was in 2017..  Independent review of the left knee x-ray from 2016 shows degenerative changes of the patellofemoral joint.   Review of Systems See HPI   HISTORY: Past Medical, Surgical, Social, and Family History Reviewed & Updated per EMR.   Pertinent Historical Findings include:  Past Medical History:  Diagnosis Date  . Anxiety   . Hypertension   . Rheumatoid arthritis ([place])   . Sebaceous hyperplasia of vulva     Past Surgical History:  Procedure Laterality Date  . ABDOMINAL HYSTERECTOMY    . BIOPSY  03/10/2020   Procedure: BIOPSY;  Surgeon: Ronnette Juniper, MD;  Location: [facility];  Service: Gastroenterology;;  . COLONOSCOPY WITH PROPOFOL N/A 03/10/2020   Procedure: COLONOSCOPY WITH PROPOFOL;  Surgeon: Ronnette Juniper, MD;  Location: [facility];  Service: Gastroenterology;  Laterality: N/A;  . ESOPHAGOGASTRODUODENOSCOPY (EGD) WITH PROPOFOL N/A 03/10/2020   Procedure: ESOPHAGOGASTRODUODENOSCOPY (EGD) WITH PROPOFOL;  Surgeon: Ronnette Juniper, MD;  Location: [facility];  Service: Gastroenterology;  Laterality: N/A;  . POLYPECTOMY  03/10/2020   Procedure: POLYPECTOMY;  Surgeon: Ronnette Juniper, MD;  Location: [facility];  Service: Gastroenterology;;  . STOMACH SURGERY    . VAGINA RECONSTRUCTION SURGERY      Family History  Problem Relation Age of Onset  . Alcohol abuse Father   . Depression Father   . Depression Sister   . Alcohol abuse Sister     Social History   Socioeconomic History  . Marital status: Single    Spouse name: Not on  file  . Number of children: Not on file  . Years of education: Not on file  . Highest education level: High school graduate  Occupational History  . Not on file  Tobacco Use  . Smoking status: Never Smoker  . Smokeless tobacco: Never Used  Vaping Use  . Vaping Use: Never used  Substance and Sexual Activity  . Alcohol use: No    Alcohol/week: 0.0 standard drinks  . Drug use: No  . Sexual activity: Not on file  Other Topics Concern  . Not on file  Social History Narrative  . Not on file   Social Determinants of Health   Financial Resource Strain:   . Difficulty of Paying Living Expenses: Not on file  Food Insecurity:   . Worried About Charity fundraiser in the Last Year: Not on file  . Ran Out of Food in the Last Year: Not on file  Transportation Needs:   . Lack of Transportation (Medical): Not on file  . Lack of Transportation (Non-Medical): Not on file  Physical Activity:   . Days of Exercise per Week: Not on file  . Minutes of Exercise per Session: Not on file  Stress:   . Feeling of Stress : Not on file  Social Connections:   . Frequency of Communication with Friends and Family: Not on file  . Frequency of Social Gatherings with Friends and Family: Not on file  .  Attends Religious Services: Not on file  . Active Member of Clubs or Organizations: Not on file  . Attends Archivist Meetings: Not on file  . Marital Status: Not on file  Intimate Partner Violence:   . Fear of Current or Ex-Partner: Not on file  . Emotionally Abused: Not on file  . Physically Abused: Not on file  . Sexually Abused: Not on file     PHYSICAL EXAM:  VS: BP 123/81   Pulse 86   Ht 5\' 9"  (1.753 m)   Wt (!) 365 lb (165.6 kg)   BMI 53.90 kg/m  Physical Exam Gen: NAD, alert, cooperative with exam, well-appearing MSK:  Left knee: No obvious effusion. Normal range of motion. Tenderness palpation over the medial joint as well as the patellofemoral joint. Neurovascular  intact  Limited ultrasound: Left knee:  No obvious effusion. Normal-appearing quadricep and patellar tendon. Severe medial joint space narrowing with degenerative changes of the medial meniscus Degenerative changes of the lateral meniscus  Summary: Degenerative joint changes had meniscal changes.  Ultrasound and interpretation by Clearance Coots, MD   Aspiration/Injection Procedure Note Kelly Valenzuela May 07, 1966  Procedure: Injection Indications: Left knee pain  Procedure Details Consent: Risks of procedure as well as the alternatives and risks of each were explained to the (patient/caregiver).  Consent for procedure obtained. Time Out: Verified patient identification, verified procedure, site/side was marked, verified correct patient position, special equipment/implants available, medications/allergies/relevent history reviewed, required imaging and test results available.  Performed.  The area was cleaned with iodine and alcohol swabs.    The left knee superior lateral suprapatellar pouch was injected using 5 cc 1% lidocaine on a 21-gauge 2 inch needle.  The syringe was switched and a mixture containing 1 cc's of 40 mg Kenalog and 4 cc's of 0.25% bupivacaine was injected.  Ultrasound was used. Images were obtained in long views showing the injection.     A sterile dressing was applied.  Patient did tolerate procedure well.    ASSESSMENT & PLAN:   OA (osteoarthritis) of knee Acute exacerbation of underlying degenerative changes.  It appears that her medial compartment has worsened over time. -Counseled on home exercise therapy and supportive care. -Injection. -Could consider physical therapy or gel injections. -Could get updated x-ray images.

## 2020-08-07 NOTE — Assessment & Plan Note (Signed)
Acute exacerbation of underlying degenerative changes.  It appears that her medial compartment has worsened over time. -Counseled on home exercise therapy and supportive care. -Injection. -Could consider physical therapy or gel injections. -Could get updated x-ray images.

## 2020-08-07 NOTE — Patient Instructions (Signed)
Nice to meet you Please try ice  Please try compression  You can try the brace  Please send me a message in MyChart with any questions or updates.  Please see me back in 4 weeks.   --Dr. Raeford Razor

## 2020-08-11 ENCOUNTER — Ambulatory Visit: Payer: Medicare HMO | Admitting: Family Medicine

## 2020-09-04 ENCOUNTER — Ambulatory Visit: Payer: Medicare HMO | Admitting: Family Medicine

## 2020-09-04 NOTE — Progress Notes (Deleted)
Kelly Valenzuela - 54 y.o. female MRN [id number]  Date of birth: 29-Jun-1966  SUBJECTIVE:  Including CC & ROS.  No chief complaint on file.   Kelly Valenzuela is a 54 y.o. female that is  ***.  ***   Review of Systems See HPI   HISTORY: Past Medical, Surgical, Social, and Family History Reviewed & Updated per EMR.   Pertinent Historical Findings include:  Past Medical History:  Diagnosis Date  . Anxiety   . Hypertension   . Rheumatoid arthritis ([place])   . Sebaceous hyperplasia of vulva     Past Surgical History:  Procedure Laterality Date  . ABDOMINAL HYSTERECTOMY    . BIOPSY  03/10/2020   Procedure: BIOPSY;  Surgeon: Ronnette Juniper, MD;  Location: [facility];  Service: Gastroenterology;;  . COLONOSCOPY WITH PROPOFOL N/A 03/10/2020   Procedure: COLONOSCOPY WITH PROPOFOL;  Surgeon: Ronnette Juniper, MD;  Location: [facility];  Service: Gastroenterology;  Laterality: N/A;  . ESOPHAGOGASTRODUODENOSCOPY (EGD) WITH PROPOFOL N/A 03/10/2020   Procedure: ESOPHAGOGASTRODUODENOSCOPY (EGD) WITH PROPOFOL;  Surgeon: Ronnette Juniper, MD;  Location: [facility];  Service: Gastroenterology;  Laterality: N/A;  . POLYPECTOMY  03/10/2020   Procedure: POLYPECTOMY;  Surgeon: Ronnette Juniper, MD;  Location: [facility];  Service: Gastroenterology;;  . STOMACH SURGERY    . VAGINA RECONSTRUCTION SURGERY      Family History  Problem Relation Age of Onset  . Alcohol abuse Father   . Depression Father   . Depression Sister   . Alcohol abuse Sister     Social History   Socioeconomic History  . Marital status: Single    Spouse name: Not on file  . Number of children: Not on file  . Years of education: Not on file  . Highest education level: High school graduate  Occupational History  . Not on file  Tobacco Use  . Smoking status: Never Smoker  . Smokeless tobacco: Never Used  Vaping Use  . Vaping Use: Never used  Substance and Sexual Activity  . Alcohol use: No    Alcohol/week: 0.0 standard  drinks  . Drug use: No  . Sexual activity: Not on file  Other Topics Concern  . Not on file  Social History Narrative  . Not on file   Social Determinants of Health   Financial Resource Strain:   . Difficulty of Paying Living Expenses: Not on file  Food Insecurity:   . Worried About Charity fundraiser in the Last Year: Not on file  . Ran Out of Food in the Last Year: Not on file  Transportation Needs:   . Lack of Transportation (Medical): Not on file  . Lack of Transportation (Non-Medical): Not on file  Physical Activity:   . Days of Exercise per Week: Not on file  . Minutes of Exercise per Session: Not on file  Stress:   . Feeling of Stress : Not on file  Social Connections:   . Frequency of Communication with Friends and Family: Not on file  . Frequency of Social Gatherings with Friends and Family: Not on file  . Attends Religious Services: Not on file  . Active Member of Clubs or Organizations: Not on file  . Attends Archivist Meetings: Not on file  . Marital Status: Not on file  Intimate Partner Violence:   . Fear of Current or Ex-Partner: Not on file  . Emotionally Abused: Not on file  . Physically Abused: Not on file  . Sexually Abused: Not on file  PHYSICAL EXAM:  VS: There were no vitals taken for this visit. Physical Exam Gen: NAD, alert, cooperative with exam, well-appearing MSK:  ***      ASSESSMENT & PLAN:   No problem-specific Assessment & Plan notes found for this encounter.

## 2020-10-09 ENCOUNTER — Ambulatory Visit: Payer: Medicare HMO | Admitting: Medical

## 2021-08-10 ENCOUNTER — Encounter (HOSPITAL_BASED_OUTPATIENT_CLINIC_OR_DEPARTMENT_OTHER): Payer: Self-pay

## 2021-08-10 ENCOUNTER — Other Ambulatory Visit: Payer: Self-pay

## 2021-08-10 ENCOUNTER — Emergency Department (HOSPITAL_BASED_OUTPATIENT_CLINIC_OR_DEPARTMENT_OTHER)
Admission: EM | Admit: 2021-08-10 | Discharge: 2021-08-10 | Disposition: A | Discharge status: Home / Self Care | Payer: Medicare HMO | Attending: Emergency Medicine | Admitting: Emergency Medicine

## 2021-08-10 DIAGNOSIS — I10 Essential (primary) hypertension: Secondary | ICD-10-CM | POA: Insufficient documentation

## 2021-08-10 DIAGNOSIS — L03115 Cellulitis of right lower limb: Secondary | ICD-10-CM | POA: Diagnosis not present

## 2021-08-10 MED ORDER — DOXYCYCLINE HYCLATE 100 MG PO CAPS: 100.0000 mg | ORAL_CAPSULE | Freq: Two times a day (BID) | ORAL | 0 refills | Status: AC

## 2021-08-10 NOTE — ED Provider Notes (Signed)
[name] Note   CSN: [id number] Arrival date & time: 08/10/21  1111     History Chief Complaint  Patient presents with   Abscess    Kelly Valenzuela is a 55 y.o. female with past medical history of obesity and hypertension who presents with 2 weeks of a "abscess" on the upper right thigh.  Patient reports that it started out as what looked like a cyst with a white follicular pore at the center.  Patient reports that she squeezed it when it first arose and was only able to express a small amount of white material.  Since then the area of painful swelling has increased, has become red, and there is a wound at the center.  Patient denies systemic fever chills diaphoresis.  She is not having pain distal to the area of injury.  She does complain of some redness under her pannus proximal to the area of the injury.  She has been using antibacterial soap as well as a "animal fat abscess pad" with no relief.  No previous history of MRSA.   Abscess Associated symptoms: no fever       Past Medical History:  Diagnosis Date   Anxiety    Hypertension    Rheumatoid arthritis ([place])    Sebaceous hyperplasia of vulva     Patient Active Problem List   Diagnosis Date Noted   OA (osteoarthritis) of knee 03/12/2016   Left knee pain 01/26/2016   Metatarsal deformity 12/18/2014   Equinus deformity of foot, acquired 12/18/2014   Pronation deformity of ankle, acquired 12/18/2014   Metatarsalgia of both feet 12/18/2014   H/O: hysterectomy 11/21/2014   HLD (hyperlipidemia) 07/26/2014   Hyperinsulinemia 07/26/2014   Obesity, diabetes, and hypertension syndrome ([place]) 07/24/2014   Body mass index (BMI) of 50-59.9 in adult ([place]) 06/05/2014   Anxiety 06/05/2014   BP (high blood pressure) 06/05/2014   Apnea, sleep 06/05/2014   History of surgical procedure 06/05/2014   Bipolar disorder, current episode mixed, moderate ([place]) 03/18/2014   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   Degeneration of intervertebral disc of lumbosacral region 02/01/2014   Acid reflux 05/26/2013    Past Surgical History:  Procedure Laterality Date   ABDOMINAL HYSTERECTOMY     BIOPSY  03/10/2020   Procedure: BIOPSY;  Surgeon: Ronnette Juniper, MD;  Location: [facility];  Service: Gastroenterology;;   COLONOSCOPY WITH PROPOFOL N/A 03/10/2020   Procedure: COLONOSCOPY WITH PROPOFOL;  Surgeon: Ronnette Juniper, MD;  Location: [facility];  Service: Gastroenterology;  Laterality: N/A;   ESOPHAGOGASTRODUODENOSCOPY (EGD) WITH PROPOFOL N/A 03/10/2020   Procedure: ESOPHAGOGASTRODUODENOSCOPY (EGD) WITH PROPOFOL;  Surgeon: Ronnette Juniper, MD;  Location: [facility];  Service: Gastroenterology;  Laterality: N/A;   POLYPECTOMY  03/10/2020   Procedure: POLYPECTOMY;  Surgeon: Ronnette Juniper, MD;  Location: [facility];  Service: Gastroenterology;;   STOMACH SURGERY     VAGINA RECONSTRUCTION SURGERY       OB History     Gravida  2   Para  2   Term  2   Preterm      AB      Living         SAB      IAB      Ectopic      Multiple      Live Births              Family History  Problem Relation Age of Onset   Alcohol abuse Father  Depression Father    Depression Sister    Alcohol abuse Sister     Social History   Tobacco Use   Smoking status: Never   Smokeless tobacco: Never  Vaping Use   Vaping Use: Never used  Substance Use Topics   Alcohol use: No    Alcohol/week: 0.0 standard drinks   Drug use: No    Home Medications Prior to Admission medications   Medication Sig Start Date End Date Taking? Authorizing Provider  doxycycline (VIBRAMYCIN) 100 MG capsule Take 1 capsule (100 mg total) by mouth 2 (two) times daily for 7 days. 08/10/21 08/17/21 Yes Ralph Benavidez H, PA-C  acetaminophen (TYLENOL) 500 MG tablet Take 1,000 mg by mouth every 6 (six) hours as needed for moderate pain or headache.    [provider]  alprazolam Duanne Moron) 2 MG tablet Take 2 mg  by mouth in the morning, at noon, in the evening, and at bedtime.    [provider]  benztropine (COGENTIN) 1 MG tablet Take 1 mg by mouth at bedtime.    [provider]  Candesartan Cilexetil-HCTZ 32-25 MG TABS Take 1 tablet by mouth daily. 04/03/18   [provider]  carvedilol (COREG) 6.25 MG tablet Take 6.25 mg by mouth in the morning and at bedtime.  01/18/16   [provider]  cholecalciferol (VITAMIN D3) 25 MCG (1000 UNIT) tablet Take 1,000 Units by mouth 2 (two) times a week.    [provider]  DEXILANT 60 MG capsule Take 60 mg by mouth daily.  01/10/16   [provider]  famotidine (PEPCID) 40 MG tablet Take 40 mg by mouth at bedtime.    [provider]  hydrOXYzine (VISTARIL) 50 MG capsule Take 50 mg by mouth 4 (four) times daily. 02/22/20   [provider]  INVEGA SUSTENNA 156 MG/ML SUSY injection Inject 156 mg into the muscle every 30 (thirty) days. 02/20/20   [provider]  lamoTRIgine (LAMICTAL) 150 MG tablet Take 150 mg by mouth at bedtime.    [provider]  Lemborexant (DAYVIGO) 10 MG TABS Take 10 mg by mouth at bedtime.    [provider]  nystatin ointment (MYCOSTATIN) Apply 1 application topically 2 (two) times daily as needed for rash. 02/27/20   [provider]  oxyCODONE (ROXICODONE) 15 MG immediate release tablet Take 15 mg by mouth in the morning and at bedtime.    [provider]  prazosin (MINIPRESS) 5 MG capsule Take 5 mg by mouth at bedtime.    [provider]  promethazine (PHENERGAN) 25 MG tablet Take 25 mg by mouth in the morning and at bedtime.    [provider]  RINVOQ 15 MG TB24 Take 15 mg by mouth daily. 02/23/20   [provider]  tiZANidine (ZANAFLEX) 4 MG tablet Take 1 tablet (4 mg total) by mouth every 6 (six) hours as needed for muscle spasms. Patient taking differently: Take 4 mg by mouth 3 (three) times daily as needed  for muscle spasms.  03/31/16   Dene Gentry, MD  zolpidem (AMBIEN) 10 MG tablet  01/14/16 08/29/18  [provider]    Allergies    Hydrocodone, Gabapentin, Norvasc [amlodipine besylate], Penicillins, Pramipexole, Pregabalin, and Ropinirole  Review of Systems   Review of Systems  Constitutional:  Negative for activity change, chills and fever.  Respiratory:  Negative for chest tightness and shortness of breath.   Cardiovascular:  Negative for chest pain.  Genitourinary:  Negative  for difficulty urinating, dysuria and urgency.  Skin:  Positive for color change and wound.  All other systems reviewed and are negative.  Physical Exam Updated Vital Signs BP (!) 151/71   Pulse 72   Temp 98 F (36.7 C)   Resp 18   Ht '5\' 9"'$  (1.753 m)   Wt (!) 152.4 kg   SpO2 100%   BMI 49.62 kg/m   Physical Exam Vitals and nursing note reviewed.  Constitutional:      General: She is not in acute distress.    Appearance: Normal appearance.  HENT:     Head: Normocephalic and atraumatic.  Eyes:     General:        Right eye: No discharge.        Left eye: No discharge.  Cardiovascular:     Rate and Rhythm: Normal rate and regular rhythm.     Heart sounds: No murmur heard.   No friction rub. No gallop.  Pulmonary:     Effort: Pulmonary effort is normal.     Breath sounds: Normal breath sounds.  Abdominal:     General: There is no distension.     Palpations: Abdomen is soft.  Skin:    General: Skin is warm and dry.     Capillary Refill: Capillary refill takes less than 2 seconds.     Comments: Large erythematous indurated patch of approximately 6 x 4 cm at the anterior right thigh close to the inguinal fold, with an approximately 1 cm circular area of eschar at the center.  No red streaking above or below the site of injury.  Area does not appear to be fluctuant.  Ultrasound of the lesion was performed and shows diffuse cobblestoning, a small area of fluid directly below the central  pore.  Underneath the patient's pannus there are some erythematous skin changes and irritation.  Neurological:     Mental Status: She is alert and oriented to person, place, and time.  Psychiatric:        Mood and Affect: Mood normal.        Behavior: Behavior normal.    ED Results / Procedures / Treatments   Labs (all labs ordered are listed, but only abnormal results are displayed) Labs Reviewed - No data to display  EKG None  Radiology No results found.  Procedures Procedures   Medications Ordered in ED Medications - No data to display  ED Course  I have reviewed the triage vital signs and the nursing notes.  Pertinent labs & imaging results that were available during my care of the patient were reviewed by me and considered in my medical decision making (see chart for details).    MDM Rules/Calculators/A&P                         Discussed with the patient it would be reasonable to make a small incision to try to express the small area of fluid directly underneath the pore, however believe that patient would benefit from oral antibiotics without fluid expression as there is not a large fluid collection from my assessment with ultrasound.  After this discussion patient opts to go with oral antibiotics alone.  Recommend that she continue to use warm compresses several times a day and antibacterial soap.  She was told to follow-up with her primary care for resolution.  Do not favor systemic infection or bacteremia patient's vital signs were normal with no signs of systemic infection.  Patient given extensive return precautions if wound begins to spread or she develops systemic fever. Final Clinical Impression(s) / ED Diagnoses Final diagnoses:  Cellulitis of right lower extremity    Rx / DC Orders ED Discharge Orders          Ordered    doxycycline (VIBRAMYCIN) 100 MG capsule  2 times daily        08/10/21 1206             Bashir Marchetti, [place] H, PA-C 08/10/21  [place], [place], DO 08/10/21 1458

## 2021-08-10 NOTE — ED Triage Notes (Signed)
Pt c/o redness and swelling with a black center x 2 weeks to right groin. States "looks infected"

## 2021-08-10 NOTE — Discharge Instructions (Addendum)
As we discussed this day I believe that you have some cellulitis on the affected limb, based on what we saw on the ultrasound probe I do not see a large fluid collection that would suggest cyst or an abscess.  I prescribed you some doxycycline to take for the next week.  Continue to use antibacterial soap as well as warm compresses to the affected area.  Please follow-up with your primary care to follow for resolution.

## 2021-10-09 ENCOUNTER — Emergency Department (HOSPITAL_BASED_OUTPATIENT_CLINIC_OR_DEPARTMENT_OTHER)
Admission: EM | Admit: 2021-10-09 | Discharge: 2021-10-09 | Disposition: A | Discharge status: Home / Self Care | Payer: Medicare HMO | Attending: Emergency Medicine | Admitting: Emergency Medicine

## 2021-10-09 ENCOUNTER — Other Ambulatory Visit: Payer: Self-pay

## 2021-10-09 ENCOUNTER — Encounter (HOSPITAL_BASED_OUTPATIENT_CLINIC_OR_DEPARTMENT_OTHER): Payer: Self-pay | Admitting: *Deleted

## 2021-10-09 DIAGNOSIS — I10 Essential (primary) hypertension: Secondary | ICD-10-CM | POA: Insufficient documentation

## 2021-10-09 DIAGNOSIS — Z79899 Other long term (current) drug therapy: Secondary | ICD-10-CM | POA: Diagnosis not present

## 2021-10-09 DIAGNOSIS — L0211 Cutaneous abscess of neck: Secondary | ICD-10-CM | POA: Insufficient documentation

## 2021-10-09 DIAGNOSIS — L0291 Cutaneous abscess, unspecified: Secondary | ICD-10-CM

## 2021-10-09 DIAGNOSIS — R221 Localized swelling, mass and lump, neck: Secondary | ICD-10-CM | POA: Diagnosis present

## 2021-10-09 MED ORDER — DOXYCYCLINE HYCLATE 100 MG PO CAPS: 100.0000 mg | ORAL_CAPSULE | Freq: Two times a day (BID) | ORAL | 0 refills | Status: AC

## 2021-10-09 MED ORDER — LIDOCAINE-EPINEPHRINE (PF) 2 %-1:200000 IJ SOLN
20.0000 mL | Freq: Once | INTRAMUSCULAR | Status: AC
  Administered 2021-10-09: 20 mL
  Filled 2021-10-09: qty 20

## 2021-10-09 MED ORDER — DOXYCYCLINE HYCLATE 100 MG PO CAPS: 100.0000 mg | ORAL_CAPSULE | Freq: Two times a day (BID) | ORAL | 0 refills | Status: DC

## 2021-10-09 NOTE — ED Provider Notes (Signed)
[facility] Provider Note   CSN: [id number] Arrival date & time: 10/09/21  1633     History Chief Complaint  Patient presents with   Abscess    Kelly Valenzuela is a 55 y.o. female patient has a past medical history of obesity, diabetes  Patient presents to the emergency department with an abscess to the inferior side of chin/superior anterior neck region.  The abscess has been there for about 1 week.  Started out like a pimple and has gradually worsened since then.  It is tender to the touch.  Patient had a recent abscess to her right thigh where she was seen in the emergency department and discharged home with antibiotics without I&D at that time.  She ended up being seen by her PCP where she did ultimately have to have an I&D before it would go away.  MRSA grew out at that time.  Patient denies any fever or chills, difficulty swallowing, sore throat, voice changes.   Abscess Associated symptoms: no fever, no headaches, no nausea and no vomiting       Past Medical History:  Diagnosis Date   Anxiety    Hypertension    Rheumatoid arthritis ([place])    Sebaceous hyperplasia of vulva     Patient Active Problem List   Diagnosis Date Noted   OA (osteoarthritis) of knee 03/12/2016   Left knee pain 01/26/2016   Metatarsal deformity 12/18/2014   Equinus deformity of foot, acquired 12/18/2014   Pronation deformity of ankle, acquired 12/18/2014   Metatarsalgia of both feet 12/18/2014   H/O: hysterectomy 11/21/2014   HLD (hyperlipidemia) 07/26/2014   Hyperinsulinemia 07/26/2014   Obesity, diabetes, and hypertension 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[place]) 07/24/2014   Body mass index (BMI) of 50-59.9 in adult ([place]) 06/05/2014   Anxiety 06/05/2014   BP (high blood pressure) 06/05/2014   Apnea, sleep 06/05/2014   History of surgical procedure 06/05/2014   Bipolar disorder, current episode mixed, moderate ([place]) 03/18/2014   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   Degeneration of  intervertebral disc of lumbosacral region 02/01/2014   Acid reflux 05/26/2013    Past Surgical History:  Procedure Laterality Date   ABDOMINAL HYSTERECTOMY     BIOPSY  03/10/2020   Procedure: BIOPSY;  Surgeon: Ronnette Juniper, MD;  Location: [facility];  Service: Gastroenterology;;   COLONOSCOPY WITH PROPOFOL N/A 03/10/2020   Procedure: COLONOSCOPY WITH PROPOFOL;  Surgeon: Ronnette Juniper, MD;  Location: [facility];  Service: Gastroenterology;  Laterality: N/A;   ESOPHAGOGASTRODUODENOSCOPY (EGD) WITH PROPOFOL N/A 03/10/2020   Procedure: ESOPHAGOGASTRODUODENOSCOPY (EGD) WITH PROPOFOL;  Surgeon: Ronnette Juniper, MD;  Location: [facility];  Service: Gastroenterology;  Laterality: N/A;   POLYPECTOMY  03/10/2020   Procedure: POLYPECTOMY;  Surgeon: Ronnette Juniper, MD;  Location: [facility];  Service: Gastroenterology;;   STOMACH SURGERY     VAGINA RECONSTRUCTION SURGERY       OB History     Gravida  2   Para  2   Term  2   Preterm      AB      Living         SAB      IAB      Ectopic      Multiple      Live Births              Family History  Problem Relation Age of Onset   Alcohol abuse Father    Depression Father    Depression Sister    Alcohol  abuse Sister     Social History   Tobacco Use   Smoking status: Never   Smokeless tobacco: Never  Vaping Use   Vaping Use: Never used  Substance Use Topics   Alcohol use: No    Alcohol/week: 0.0 standard drinks   Drug use: No    Home Medications Prior to Admission medications   Medication Sig Start Date End Date Taking? Authorizing Provider  doxycycline (VIBRAMYCIN) 100 MG capsule Take 1 capsule (100 mg total) by mouth 2 (two) times daily for 10 days. 10/09/21 10/19/21 Yes Javen Ridings, Adora Fridge, PA-C  acetaminophen (TYLENOL) 500 MG tablet Take 1,000 mg by mouth every 6 (six) hours as needed for moderate pain or headache.    [provider]  alprazolam Duanne Moron) 2 MG tablet Take 2 mg by mouth in the morning, at  noon, in the evening, and at bedtime.    [provider]  benztropine (COGENTIN) 1 MG tablet Take 1 mg by mouth at bedtime.    [provider]  Candesartan Cilexetil-HCTZ 32-25 MG TABS Take 1 tablet by mouth daily. 04/03/18   [provider]  carvedilol (COREG) 6.25 MG tablet Take 6.25 mg by mouth in the morning and at bedtime.  01/18/16   [provider]  cholecalciferol (VITAMIN D3) 25 MCG (1000 UNIT) tablet Take 1,000 Units by mouth 2 (two) times a week.    [provider]  DEXILANT 60 MG capsule Take 60 mg by mouth daily.  01/10/16   [provider]  famotidine (PEPCID) 40 MG tablet Take 40 mg by mouth at bedtime.    [provider]  hydrOXYzine (VISTARIL) 50 MG capsule Take 50 mg by mouth 4 (four) times daily. 02/22/20   [provider]  INVEGA SUSTENNA 156 MG/ML SUSY injection Inject 156 mg into the muscle every 30 (thirty) days. 02/20/20   [provider]  lamoTRIgine (LAMICTAL) 150 MG tablet Take 150 mg by mouth at bedtime.    [provider]  Lemborexant (DAYVIGO) 10 MG TABS Take 10 mg by mouth at bedtime.    [provider]  nystatin ointment (MYCOSTATIN) Apply 1 application topically 2 (two) times daily as needed for rash. 02/27/20   [provider]  oxyCODONE (ROXICODONE) 15 MG immediate release tablet Take 15 mg by mouth in the morning and at bedtime.    [provider]  prazosin (MINIPRESS) 5 MG capsule Take 5 mg by mouth at bedtime.    [provider]  promethazine (PHENERGAN) 25 MG tablet Take 25 mg by mouth in the morning and at bedtime.    [provider]  RINVOQ 15 MG TB24 Take 15 mg by mouth daily. 02/23/20   [provider]  tiZANidine (ZANAFLEX) 4 MG tablet Take 1 tablet (4 mg total) by mouth every 6 (six) hours as needed for muscle spasms. Patient taking differently: Take 4 mg by mouth 3 (three) times daily as needed for muscle spasms.  03/31/16    Dene Gentry, MD  zolpidem (AMBIEN) 10 MG tablet  01/14/16 08/29/18  [provider]    Allergies    Hydrocodone, Gabapentin, Norvasc [amlodipine besylate], Penicillins, Pramipexole, Pregabalin, and Ropinirole  Review of Systems   Review of Systems  Constitutional:  Negative for chills and fever.  HENT:  Negative for congestion, rhinorrhea and sore throat.   Eyes:  Negative for visual disturbance.  Respiratory:  Negative for cough, chest tightness and shortness of breath.   Cardiovascular:  Negative for  chest pain, palpitations and leg swelling.  Gastrointestinal:  Negative for abdominal pain, blood in stool, constipation, diarrhea, nausea and vomiting.  Genitourinary:  Negative for dysuria, flank pain and hematuria.  Musculoskeletal:  Negative for back pain.  Skin:  Positive for wound. Negative for rash.  Neurological:  Negative for dizziness, syncope, weakness, light-headedness and headaches.  Psychiatric/Behavioral:  Negative for confusion.   All other systems reviewed and are negative.  Physical Exam Updated Vital Signs BP (!) 143/90 (BP Location: Right Arm)   Pulse 75   Temp 98.4 F (36.9 C) (Oral)   Resp 20   Ht 5\' 9"  (1.753 m)   Wt (!) 149.7 kg   SpO2 100%   BMI 48.73 kg/m   Physical Exam Vitals and nursing note reviewed.  Constitutional:      General: She is not in acute distress.    Appearance: Normal appearance. She is well-developed. She is not ill-appearing, toxic-appearing or diaphoretic.  HENT:     Head: Normocephalic and atraumatic.     Nose: Nose normal. No nasal deformity, congestion or rhinorrhea.     Mouth/Throat:     Lips: Pink. No lesions.     Mouth: Mucous membranes are moist.     Pharynx: Oropharynx is clear. No oropharyngeal exudate or posterior oropharyngeal erythema.  Eyes:     General: Gaze aligned appropriately. No scleral icterus.       Right eye: No discharge.        Left eye: No discharge.     Conjunctiva/sclera:  Conjunctivae normal.     Right eye: Right conjunctiva is not injected. No exudate or hemorrhage.    Left eye: Left conjunctiva is not injected. No exudate or hemorrhage. Neck:     Comments: Superficial fluctuant area is present to inferior chin/anterior superior neck. It is approximately 2 x 2 cm. Tender to the touch. No significant surrounding erythema, but involved area is erythematous. No associated lymphadenopathy. Swallowing function intact. Trachea midline. No associated stridor or signs of respiratory distress.  Pulmonary:     Effort: Pulmonary effort is normal. No respiratory distress.     Breath sounds: No stridor.  Lymphadenopathy:     Cervical: No cervical adenopathy.  Skin:    General: Skin is warm and dry.     Findings: Abscess and erythema present.     Comments: See neck PE  Neurological:     Mental Status: She is alert and oriented to person, place, and time.  Psychiatric:        Mood and Affect: Mood normal.        Speech: Speech normal.        Behavior: Behavior normal. Behavior is cooperative.    ED Results / Procedures / Treatments   Labs (all labs ordered are listed, but only abnormal results are displayed) Labs Reviewed - No data to display  EKG None  Radiology No results found.  Procedures .Marland KitchenIncision and Drainage  Date/Time: 10/09/2021 6:33 PM Performed by: Adolphus Birchwood, PA-C Authorized by: Adolphus Birchwood, PA-C   Consent:    Consent obtained:  Verbal   Consent given by:  Patient   Risks discussed:  Bleeding, incomplete drainage, pain and damage to other organs   Alternatives discussed:  No treatment Universal protocol:    Procedure explained and questions answered to patient or proxy's satisfaction: yes     Patient identity confirmed:  Verbally with patient Location:    Type:  Abscess   Size:  2 x 2  cm   Location:  Neck   Neck location:  L anterior Pre-procedure details:    Skin preparation:  Betadine Anesthesia:    Anesthesia  method:  Local infiltration   Local anesthetic:  Lidocaine 1% WITH epi Procedure type:    Complexity:  Simple Procedure details:    Ultrasound guidance: no     Needle aspiration: no     Incision types:  Single straight   Incision depth:  Subcutaneous   Wound management:  Probed and deloculated, irrigated with saline and extensive cleaning   Drainage:  Serosanguinous   Drainage amount:  Moderate   Wound treatment:  Wound left open   Packing materials:  None Post-procedure details:    Procedure completion:  Tolerated well, no immediate complications   Medications Ordered in ED Medications  lidocaine-EPINEPHrine (XYLOCAINE W/EPI) 2 %-1:200000 (PF) injection 20 mL (20 mLs Infiltration Given 10/09/21 1758)    ED Course  I have reviewed the triage vital signs and the nursing notes.  Pertinent labs & imaging results that were available during my care of the patient were reviewed by me and considered in my medical decision making (see chart for details).    MDM Rules/Calculators/A&P                         Patient presents with abscess to anterior neck that has been worsening for 1 week. No systemic symptoms.  Vitals stable. Afebrile. I and D performed. Procedure outlined above. Minimal amt of serosanguinous drainage. Due to the location of the area, I do not feel that I can safely cut any deeper.  Will trial on oral antibiotics and symptomatic treatment to support I and D. Patient will follow up with PCP. Return precautions given for ED.    Final Clinical Impression(s) / ED Diagnoses Final diagnoses:  Abscess    Rx / DC Orders ED Discharge Orders          Ordered    doxycycline (VIBRAMYCIN) 100 MG capsule  2 times daily,   Status:  Discontinued        10/09/21 1824    doxycycline (VIBRAMYCIN) 100 MG capsule  2 times daily        10/09/21 1825             Sheila Oats 10/09/21 Ewell Poe, MD 10/10/21 1108

## 2021-10-09 NOTE — ED Triage Notes (Signed)
C/o abscess to chin/neck x 1 week

## 2021-10-09 NOTE — Discharge Instructions (Addendum)
  You have been seen in the ED for an abscess.  An incision and drainage to facilitate drainage of the fluid.  You will be treated with antibiotics for 10 days.  Please return if the Emergency Department if the abscess worsens or you develop symptoms such as fever.   Please schedule an appointment with your PCP in the next week for follow-up of abscess.   Please take all of your antibiotics until finished!   You may develop abdominal discomfort or diarrhea from the antibiotic.  You may help offset this with probiotics which you can buy or get in yogurt. Do not eat or take the probiotics until 2 hours after your antibiotic.

## 2021-10-25 ENCOUNTER — Emergency Department (HOSPITAL_BASED_OUTPATIENT_CLINIC_OR_DEPARTMENT_OTHER)
Admission: EM | Admit: 2021-10-25 | Discharge: 2021-10-25 | Disposition: A | Discharge status: Home / Self Care | Payer: Medicare HMO | Attending: Emergency Medicine | Admitting: Emergency Medicine

## 2021-10-25 ENCOUNTER — Other Ambulatory Visit: Payer: Self-pay

## 2021-10-25 ENCOUNTER — Encounter (HOSPITAL_BASED_OUTPATIENT_CLINIC_OR_DEPARTMENT_OTHER): Payer: Self-pay | Admitting: *Deleted

## 2021-10-25 DIAGNOSIS — R2241 Localized swelling, mass and lump, right lower limb: Secondary | ICD-10-CM | POA: Diagnosis present

## 2021-10-25 DIAGNOSIS — L739 Follicular disorder, unspecified: Secondary | ICD-10-CM | POA: Insufficient documentation

## 2021-10-25 DIAGNOSIS — I10 Essential (primary) hypertension: Secondary | ICD-10-CM | POA: Diagnosis not present

## 2021-10-25 DIAGNOSIS — Z79899 Other long term (current) drug therapy: Secondary | ICD-10-CM | POA: Diagnosis not present

## 2021-10-25 MED ORDER — MUPIROCIN CALCIUM 2 % EX CREA: 1.0000 "application " | TOPICAL_CREAM | Freq: Two times a day (BID) | CUTANEOUS | 0 refills | Status: AC

## 2021-10-25 MED ORDER — SULFAMETHOXAZOLE-TRIMETHOPRIM 800-160 MG PO TABS: 1.0000 | ORAL_TABLET | Freq: Two times a day (BID) | ORAL | 0 refills | Status: AC

## 2021-10-25 NOTE — ED Triage Notes (Signed)
Pt reports recurrent abscess to torso, left arm, and left leg

## 2021-10-25 NOTE — ED Provider Notes (Signed)
[facility] Provider Note   CSN: [id number] Arrival date & time: 10/25/21  1336     History Chief Complaint  Patient presents with   Abscess    Kelly Valenzuela is a 54 y.o. female.  Patient with past medical history of obesity and hypertension who presents with "abscesses" on multiple areas of the patients body.  Patient with longstanding history of same over the past few months with 2 I&Ds to the chin and right upper thigh for same. Patient reports each lesion starts out as what looked like a cyst with a white follicular pore at the center. She states that she would originally attempt to squeeze them which would only make them worse with minimal drainage. She states that she has been through several courses of doxycycline without resolution of symptoms. Chart review reveals that she has also trialed oral Clindamycin after previous I&D grew MRSA. She denies fevers or chills.  Additionally, she does mentions complaint of some redness and irritation under her pannus on the right side.  She has been using antibacterial soap and nystatin cream with no relief.   The history is provided by the patient. No language interpreter was used.  Abscess Associated symptoms: no fever       Past Medical History:  Diagnosis Date   Anxiety    Hypertension    Rheumatoid arthritis ([place])    Sebaceous hyperplasia of vulva     Patient Active Problem List   Diagnosis Date Noted   OA (osteoarthritis) of knee 03/12/2016   Left knee pain 01/26/2016   Metatarsal deformity 12/18/2014   Equinus deformity of foot, acquired 12/18/2014   Pronation deformity of ankle, acquired 12/18/2014   Metatarsalgia of both feet 12/18/2014   H/O: hysterectomy 11/21/2014   HLD (hyperlipidemia) 07/26/2014   Hyperinsulinemia 07/26/2014   Obesity, diabetes, and hypertension syndrome ([place]) 07/24/2014   Body mass index (BMI) of 50-59.9 in adult ([place]) 06/05/2014   Anxiety 06/05/2014   BP  (high blood pressure) 06/05/2014   Apnea, sleep 06/05/2014   History of surgical procedure 06/05/2014   Bipolar disorder, current episode mixed, moderate ([place]) 03/18/2014   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   Degeneration of intervertebral disc of lumbosacral region 02/01/2014   Acid reflux 05/26/2013    Past Surgical History:  Procedure Laterality Date   ABDOMINAL HYSTERECTOMY     BIOPSY  03/10/2020   Procedure: BIOPSY;  Surgeon: Ronnette Juniper, MD;  Location: [facility];  Service: Gastroenterology;;   COLON SURGERY     COLONOSCOPY WITH PROPOFOL N/A 03/10/2020   Procedure: COLONOSCOPY WITH PROPOFOL;  Surgeon: Ronnette Juniper, MD;  Location: [facility];  Service: Gastroenterology;  Laterality: N/A;   ESOPHAGOGASTRODUODENOSCOPY (EGD) WITH PROPOFOL N/A 03/10/2020   Procedure: ESOPHAGOGASTRODUODENOSCOPY (EGD) WITH PROPOFOL;  Surgeon: Ronnette Juniper, MD;  Location: [facility];  Service: Gastroenterology;  Laterality: N/A;   POLYPECTOMY  03/10/2020   Procedure: POLYPECTOMY;  Surgeon: Ronnette Juniper, MD;  Location: [facility];  Service: Gastroenterology;;   STOMACH SURGERY     VAGINA RECONSTRUCTION SURGERY       OB History     Gravida  2   Para  2   Term  2   Preterm      AB      Living         SAB      IAB      Ectopic      Multiple      Live Births  Family History  Problem Relation Age of Onset   Alcohol abuse Father    Depression Father    Depression Sister    Alcohol abuse Sister     Social History   Tobacco Use   Smoking status: Never   Smokeless tobacco: Never  Vaping Use   Vaping Use: Never used  Substance Use Topics   Alcohol use: No    Alcohol/week: 0.0 standard drinks   Drug use: No    Home Medications Prior to Admission medications   Medication Sig Start Date End Date Taking? Authorizing Provider  acetaminophen (TYLENOL) 500 MG tablet Take 1,000 mg by mouth every 6 (six) hours as needed for moderate pain or headache.    [provider]  alprazolam Duanne Moron) 2 MG tablet Take 2 mg by mouth in the morning, at noon, in the evening, and at bedtime.    [provider]  benztropine (COGENTIN) 1 MG tablet Take 1 mg by mouth at bedtime.    [provider]  Candesartan Cilexetil-HCTZ 32-25 MG TABS Take 1 tablet by mouth daily. 04/03/18   [provider]  carvedilol (COREG) 6.25 MG tablet Take 6.25 mg by mouth in the morning and at bedtime.  01/18/16   [provider]  cholecalciferol (VITAMIN D3) 25 MCG (1000 UNIT) tablet Take 1,000 Units by mouth 2 (two) times a week.    [provider]  DEXILANT 60 MG capsule Take 60 mg by mouth daily.  01/10/16   [provider]  famotidine (PEPCID) 40 MG tablet Take 40 mg by mouth at bedtime.    [provider]  hydrOXYzine (VISTARIL) 50 MG capsule Take 50 mg by mouth 4 (four) times daily. 02/22/20   [provider]  INVEGA SUSTENNA 156 MG/ML SUSY injection Inject 156 mg into the muscle every 30 (thirty) days. 02/20/20   [provider]  lamoTRIgine (LAMICTAL) 150 MG tablet Take 150 mg by mouth at bedtime.    [provider]  Lemborexant (DAYVIGO) 10 MG TABS Take 10 mg by mouth at bedtime.    [provider]  nystatin ointment (MYCOSTATIN) Apply 1 application topically 2 (two) times daily as needed for rash. 02/27/20   [provider]  oxyCODONE (ROXICODONE) 15 MG immediate release tablet Take 15 mg by mouth in the morning and at bedtime.    [provider]  prazosin (MINIPRESS) 5 MG capsule Take 5 mg by mouth at bedtime.    [provider]  promethazine (PHENERGAN) 25 MG tablet Take 25 mg by mouth in the morning and at bedtime.    [provider]  RINVOQ 15 MG TB24 Take 15 mg by mouth daily. 02/23/20   [provider]  tiZANidine (ZANAFLEX) 4 MG tablet Take 1 tablet (4 mg total) by mouth every 6 (six) hours as needed for muscle spasms. Patient taking  differently: Take 4 mg by mouth 3 (three) times daily as needed for muscle spasms.  03/31/16   Dene Gentry, MD  zolpidem (AMBIEN) 10 MG tablet  01/14/16 08/29/18  [provider]    Allergies    Hydrocodone, Gabapentin, Norvasc [amlodipine besylate], Penicillins, Pramipexole, Pregabalin, and Ropinirole  Review of Systems   Review of Systems  Constitutional:  Negative for chills, diaphoresis and fever.  Skin:  Positive for wound.  All other systems reviewed and are negative.  Physical Exam Updated Vital Signs BP (!) 121/93 (BP Location: Right Arm)   Pulse 62   Temp 98 F (  36.7 C) (Oral)   Resp 16   Ht 5\' 9"  (1.753 m)   Wt (!) 149 kg   SpO2 100%   BMI 48.51 kg/m   Physical Exam Vitals and nursing note reviewed.  Constitutional:      General: She is not in acute distress.    Appearance: Normal appearance. She is obese. She is not ill-appearing, toxic-appearing or diaphoretic.  Cardiovascular:     Rate and Rhythm: Normal rate.  Pulmonary:     Effort: Pulmonary effort is normal.  Abdominal:     General: Abdomen is flat.     Palpations: Abdomen is soft.  Musculoskeletal:        General: Normal range of motion.  Skin:    General: Skin is warm and dry.     Comments: Several  follicular pustules and inflamed follicular papules on the patients bilateral flanks and left arm. No red streaking above or below sites of injury.  Areas do not appear to be fluctuant or indurated. Underneath the patient's pannus there are some erythematous skin changes and irritation without drainage or infectious signs.  Neurological:     General: No focal deficit present.     Mental Status: She is alert.  Psychiatric:        Mood and Affect: Mood normal.        Behavior: Behavior normal.    ED Results / Procedures / Treatments   Labs (all labs ordered are listed, but only abnormal results are displayed) Labs Reviewed - No data to display  EKG None  Radiology No results  found.  Procedures Procedures   Medications Ordered in ED Medications - No data to display  ED Course  I have reviewed the triage vital signs and the nursing notes.  Pertinent labs & imaging results that were available during my care of the patient were reviewed by me and considered in my medical decision making (see chart for details).    MDM Rules/Calculators/A&P                         Patient presents today with several follicular lesions on different areas of her body. She has not been successful with relief on oral doxycycline or Clindamycin. I do not feel that she would benefit from I&D today, patient is in agreement. Will attempt topical antibiotics with oral Bactrim as that is something she hasnt tried and refer her to dermatology for further evaluation. Also will give barrier cream for pannus irritation. Will recommend that she also continue with her cleaning regimen in the interim before she can get in with dermatology. Will also educate patient to stop attempting to express fluid as this seems to facilitate the abscess formation. Low suspicion for systemic infection or bacteremia as patient's vital signs were normal with no signs of systemic infection.  Patient given extensive return precautions if wound begins to enlarge or become particularly painful or she develops systemic fever.  Findings and plan of care discussed with supervising physician Dr. Rogene Houston who is in agreement.     Final Clinical Impression(s) / ED Diagnoses Final diagnoses:  Folliculitis    Rx / DC Orders ED Discharge Orders          Ordered    sulfamethoxazole-trimethoprim (BACTRIM DS) 800-160 MG tablet  2 times daily        10/25/21 1801    mupirocin cream (BACTROBAN) 2 %  2 times daily  10/25/21 1801             Bud Face, PA-C 10/25/21 1803    Fredia Sorrow, MD 11/06/21 [phone]

## 2021-10-25 NOTE — Discharge Instructions (Signed)
You were seen today for evaluation of your folliculitis.  It seems that this has been relatively refractory to the go to regimens including oral antibiotics known to cover these infections.  Therefore, I feel it is imperative that you see dermatology for formal evaluation and management.  I have given you a referral to a dermatologist in [place], please call them to make an appointment in the next few days.  In the interim I have prescribed you antibiotics that it does not seem you have tried that may give you success, as well as some topical antibiotics.  Please take these as prescribed.  Additionally, please do not try to express fluid from these lesions as this can cause abscess formation.  Continue with your cleaning regimen and warm compresses over areas of discomfort.  Return if development of new or worsening symptoms.

## 2022-02-23 IMAGING — CR DG TIBIA/FIBULA 2V*R*
4 series · 4 of 4 positions shown · non-contrast
Comparison: None.

CLINICAL DATA: Fall 1 week ago with right lower leg pain and
swelling.

EXAM:
RIGHT TIBIA AND FIBULA - 2 VIEW

[t tib/fib ap right (1 of 2)]
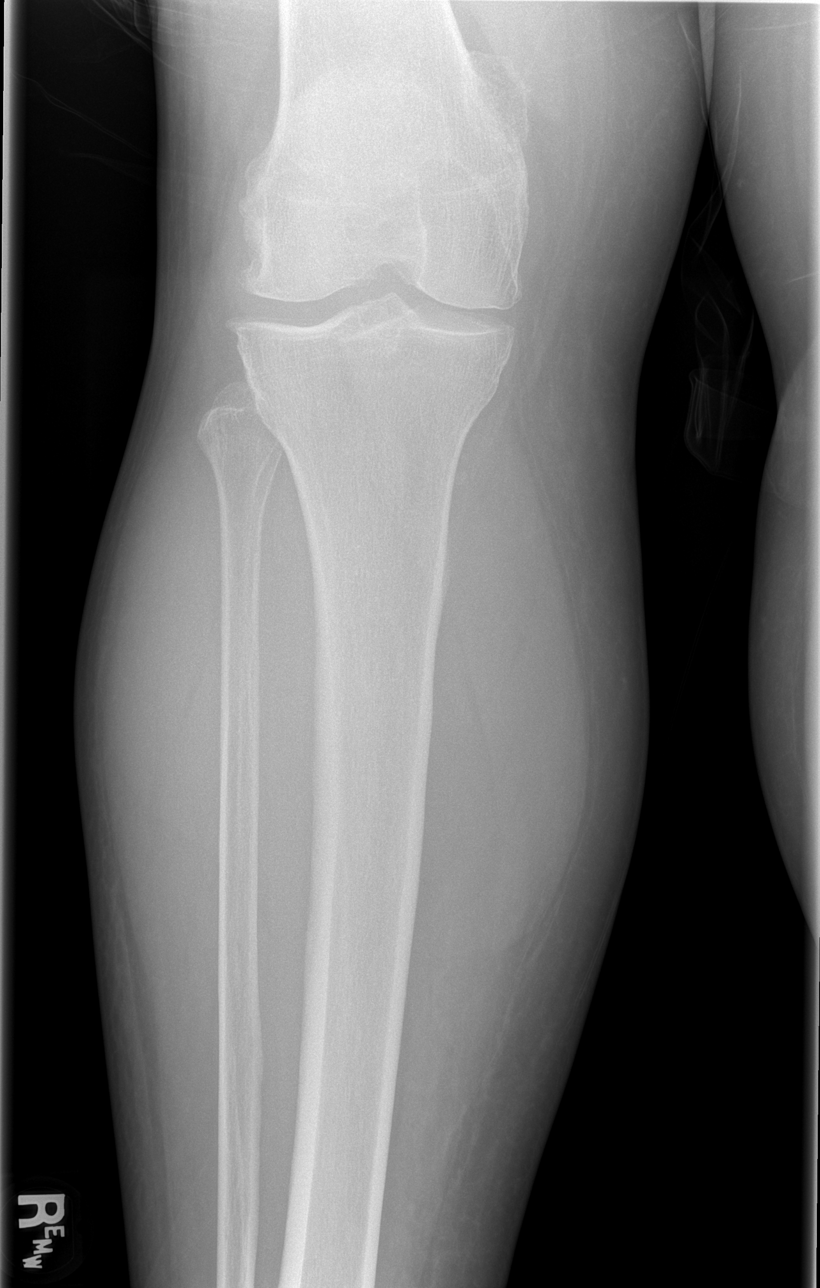

[t tib/fib ap right (2 of 2)]
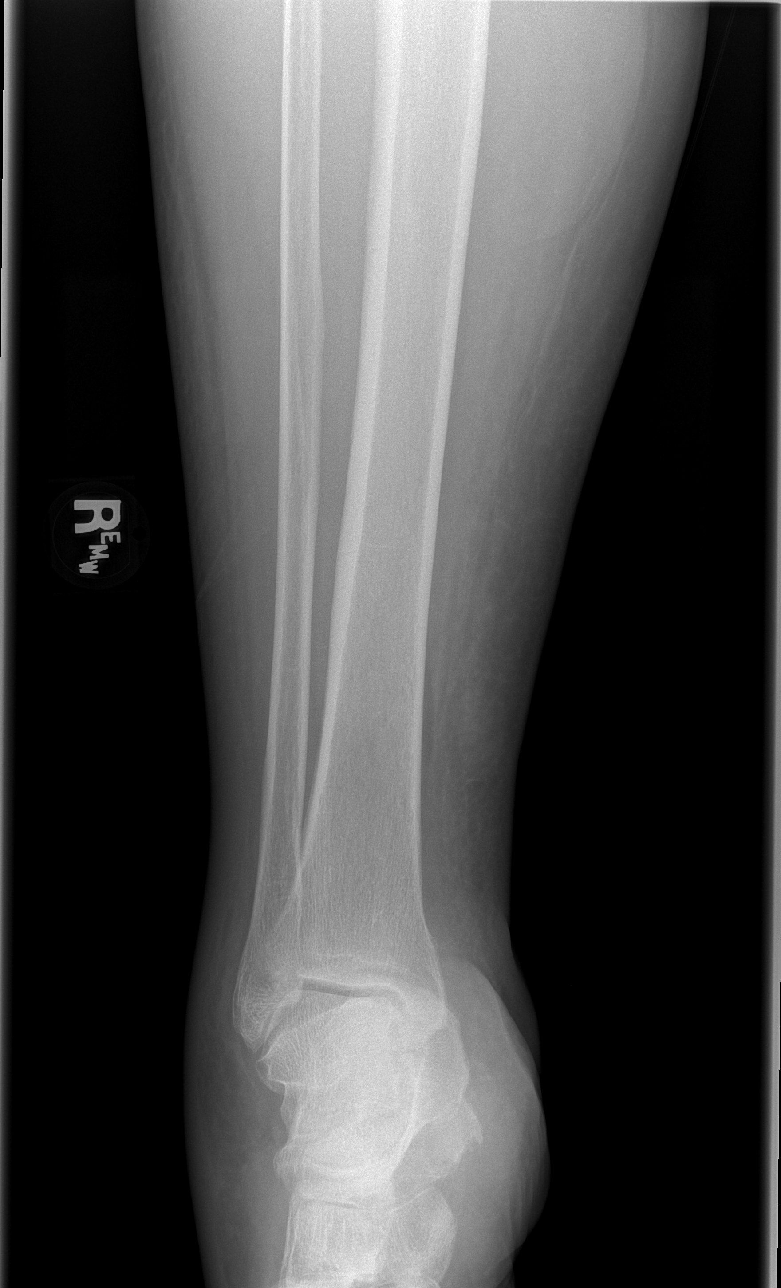

[t tib/fib lat right (1 of 2)]
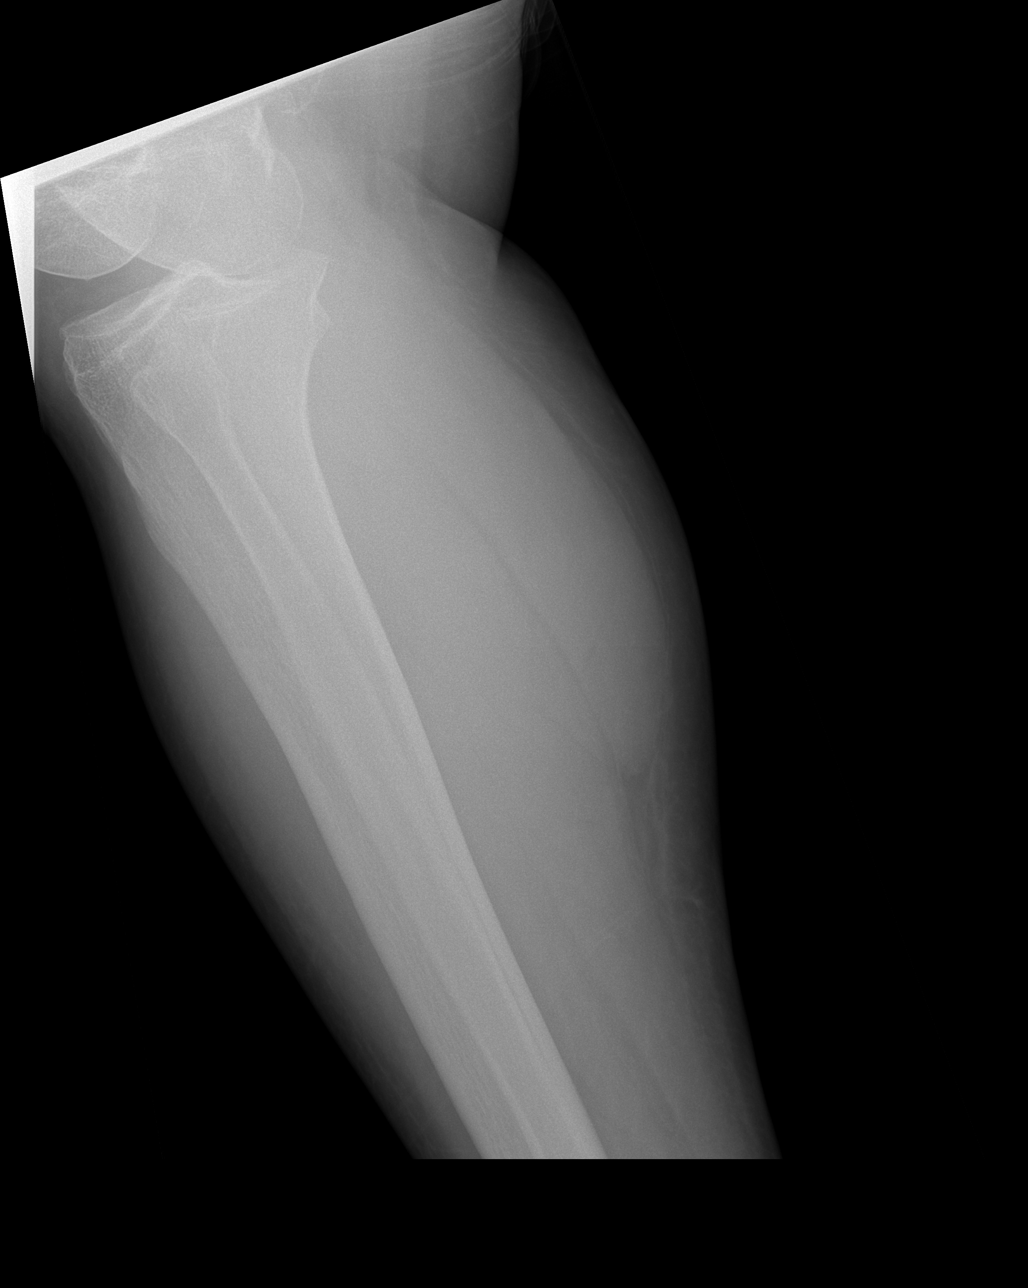

[t tib/fib lat right (2 of 2)]
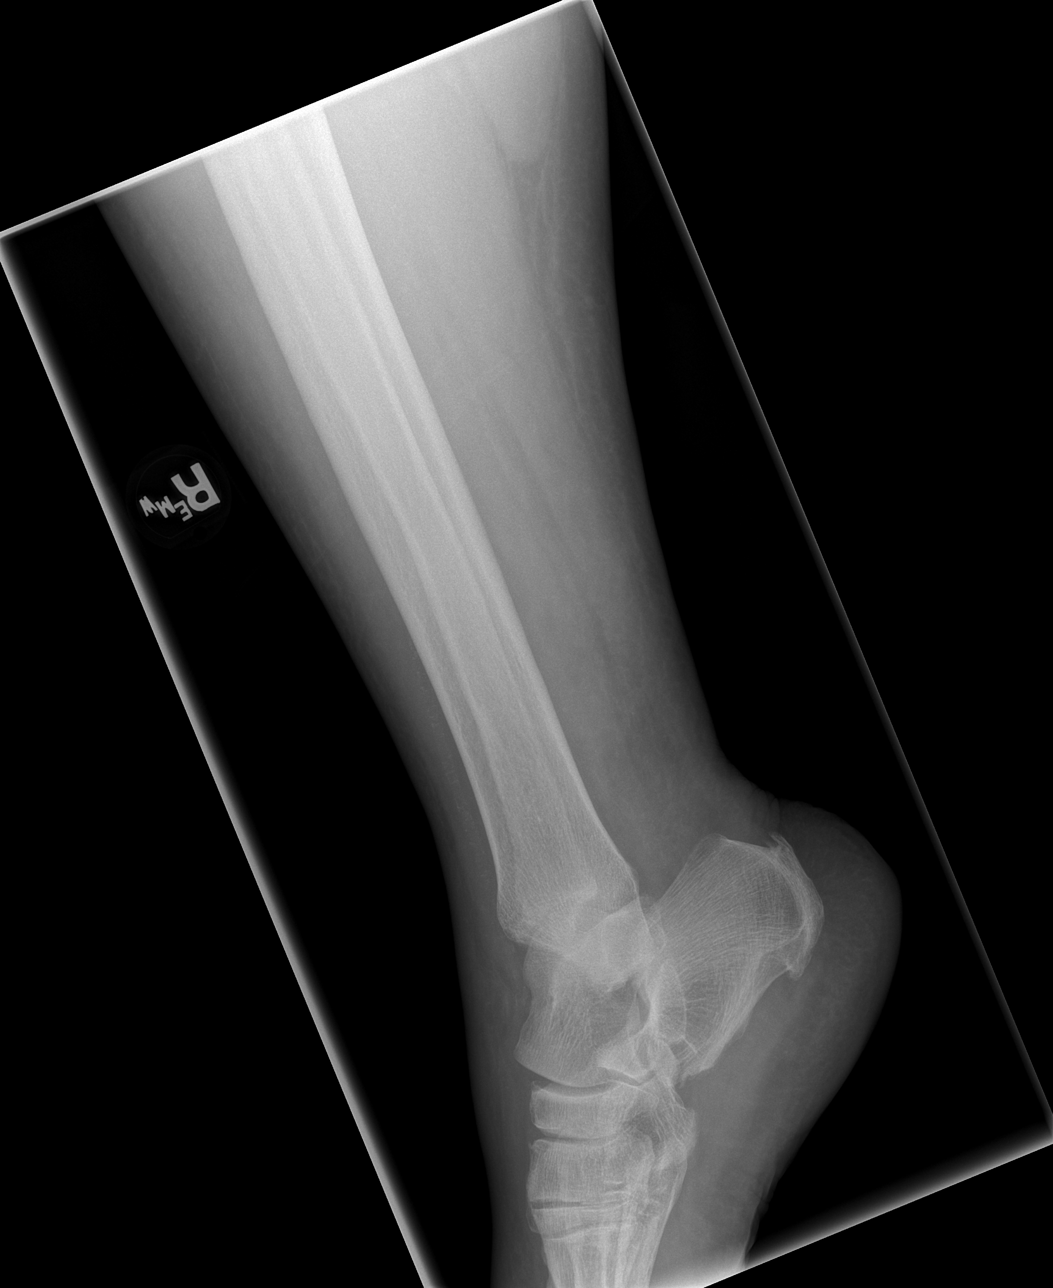

[4 of 4 positions shown; findings below may reference images not displayed]

FINDINGS: No fracture is identified. The knee is located with mild marginal
osteophytosis noted in the lateral compartment. There are small to
moderate-sized posterior and plantar calcaneal enthesophytes. Soft
tissue swelling is noted about the ankle.
IMPRESSION: No acute osseous abnormality identified.

## 2022-02-23 IMAGING — CR DG FOOT COMPLETE 3+V*R*
3 series · 3 of 3 positions shown · non-contrast
Comparison: 12/12/2014

CLINICAL DATA: Fall 1 week ago with right lower leg pain and
swelling.

EXAM:
RIGHT FOOT COMPLETE - 3+ VIEW

[t foot ap right]
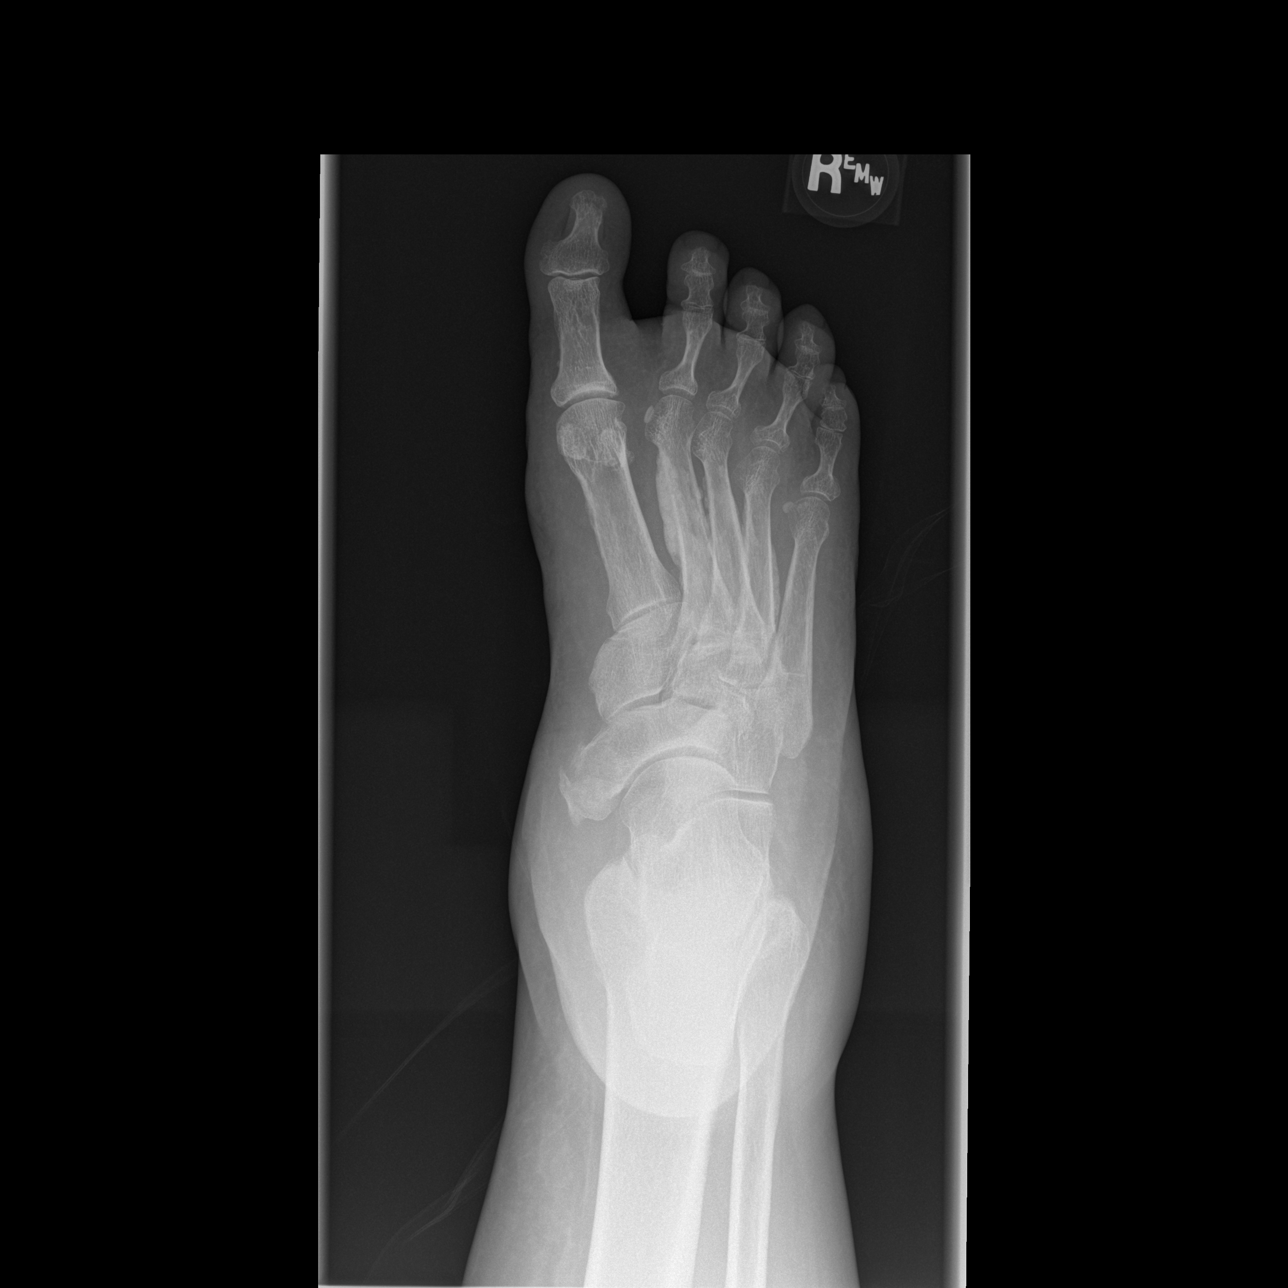

[t foot oblique right]
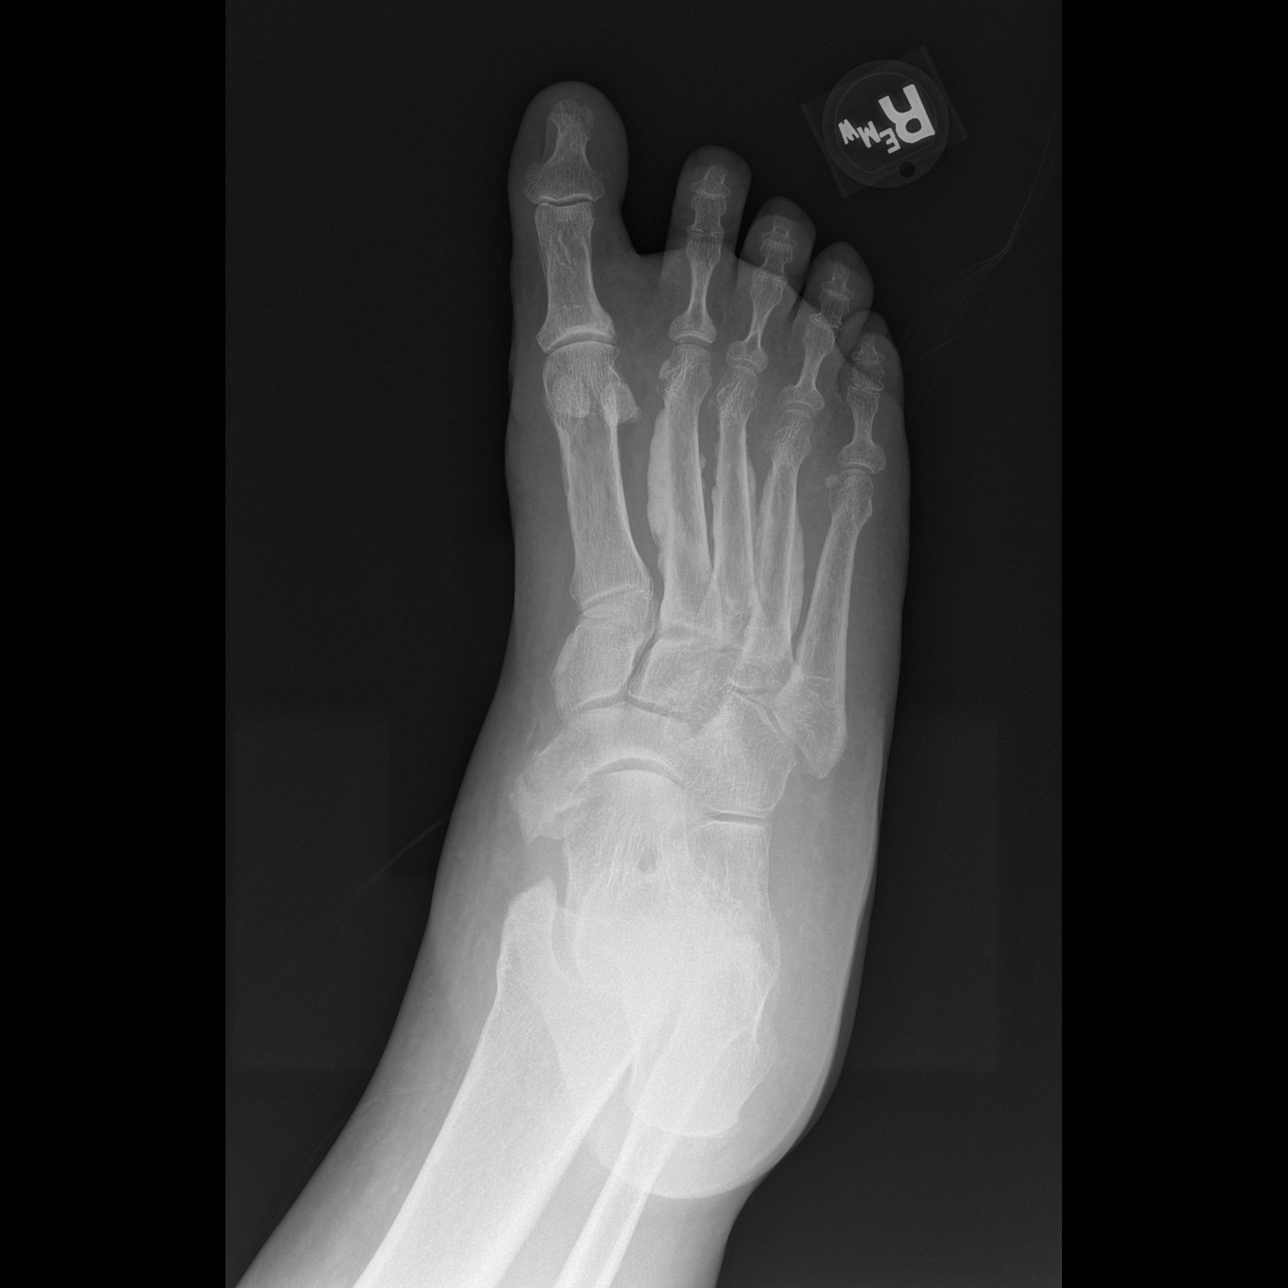

[t foot lat right]
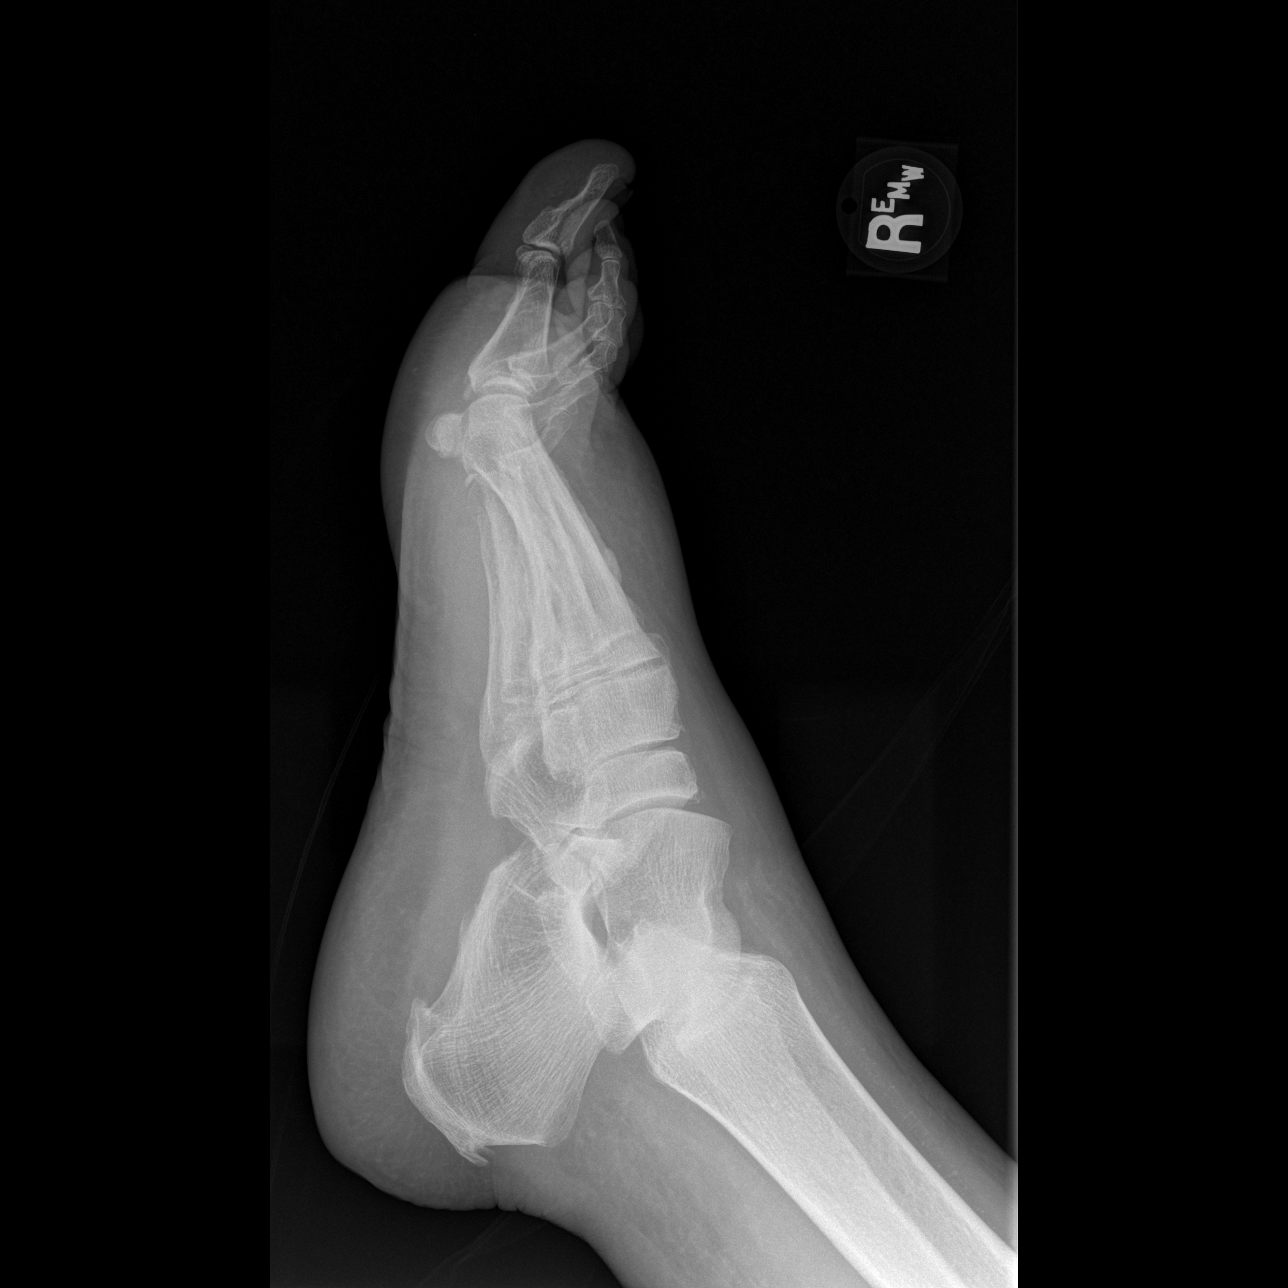

[3 of 3 positions shown; findings below may reference images not displayed]

FINDINGS: No acute fracture or dislocation is identified. Chronic,
benign-appearing periosteal reaction along the shafts of the second
through fourth metatarsals is unchanged. There is an accessory
navicular bone with associated spurring, and there are also small to
moderate-sized plantar and posterior calcaneal enthesophytes.
Moderate soft tissue swelling is noted in the forefoot.
IMPRESSION: Soft tissue swelling without acute osseous abnormality identified.

## 2022-08-05 ENCOUNTER — Encounter (HOSPITAL_BASED_OUTPATIENT_CLINIC_OR_DEPARTMENT_OTHER): Payer: Self-pay | Admitting: Emergency Medicine

## 2022-08-05 ENCOUNTER — Emergency Department (HOSPITAL_BASED_OUTPATIENT_CLINIC_OR_DEPARTMENT_OTHER)
Admission: EM | Admit: 2022-08-05 | Discharge: 2022-08-05 | Disposition: A | Discharge status: Home / Self Care | Payer: Medicare HMO | Attending: Emergency Medicine | Admitting: Emergency Medicine

## 2022-08-05 ENCOUNTER — Other Ambulatory Visit: Payer: Self-pay

## 2022-08-05 DIAGNOSIS — J069 Acute upper respiratory infection, unspecified: Secondary | ICD-10-CM | POA: Insufficient documentation

## 2022-08-05 DIAGNOSIS — H66003 Acute suppurative otitis media without spontaneous rupture of ear drum, bilateral: Secondary | ICD-10-CM | POA: Diagnosis not present

## 2022-08-05 DIAGNOSIS — H6693 Otitis media, unspecified, bilateral: Secondary | ICD-10-CM | POA: Diagnosis present

## 2022-08-05 MED ORDER — CEFDINIR 300 MG PO CAPS
300.0000 mg | ORAL_CAPSULE | Freq: Two times a day (BID) | ORAL | 0 refills | Status: AC
Start: 2022-08-05 — End: 2022-08-15

## 2022-08-05 MED ORDER — CEFDINIR 300 MG PO CAPS
300.0000 mg | ORAL_CAPSULE | Freq: Once | ORAL | Status: AC
  Administered 2022-08-05: 300 mg via ORAL
  Filled 2022-08-05: qty 1

## 2022-08-05 NOTE — ED Provider Notes (Signed)
[facility] Provider Note   CSN: [id number] Arrival date & time: 08/05/22  1755     History  Chief Complaint  Patient presents with   Otalgia    Kelly Valenzuela is a 56 y.o. female.  56 yo F with a chief complaint of bilateral ear pain cough congestion and fever.  This been going on for a few days now.  She denies any sick contacts.  Denies difficulty breathing.  Has a penicillin allergy.   Otalgia      Home Medications Prior to Admission medications   Medication Sig Start Date End Date Taking? Authorizing Provider  cefdinir (OMNICEF) 300 MG capsule Take 1 capsule (300 mg total) by mouth 2 (two) times daily for 10 days. 08/05/22 08/15/22 Yes Deno Etienne, DO  acetaminophen (TYLENOL) 500 MG tablet Take 1,000 mg by mouth every 6 (six) hours as needed for moderate pain or headache.    [provider]  alprazolam Duanne Moron) 2 MG tablet Take 2 mg by mouth in the morning, at noon, in the evening, and at bedtime.    [provider]  benztropine (COGENTIN) 1 MG tablet Take 1 mg by mouth at bedtime.    [provider]  Candesartan Cilexetil-HCTZ 32-25 MG TABS Take 1 tablet by mouth daily. 04/03/18   [provider]  carvedilol (COREG) 6.25 MG tablet Take 6.25 mg by mouth in the morning and at bedtime.  01/18/16   [provider]  cholecalciferol (VITAMIN D3) 25 MCG (1000 UNIT) tablet Take 1,000 Units by mouth 2 (two) times a week.    [provider]  DEXILANT 60 MG capsule Take 60 mg by mouth daily.  01/10/16   [provider]  famotidine (PEPCID) 40 MG tablet Take 40 mg by mouth at bedtime.    [provider]  hydrOXYzine (VISTARIL) 50 MG capsule Take 50 mg by mouth 4 (four) times daily. 02/22/20   [provider]  INVEGA SUSTENNA 156 MG/ML SUSY injection Inject 156 mg into the muscle every 30 (thirty) days. 02/20/20   [provider]  lamoTRIgine (LAMICTAL) 150 MG tablet Take  150 mg by mouth at bedtime.    [provider]  Lemborexant (DAYVIGO) 10 MG TABS Take 10 mg by mouth at bedtime.    [provider]  mupirocin cream (BACTROBAN) 2 % Apply 1 application topically 2 (two) times daily. 10/25/21   Smoot, Leary Roca, PA-C  nystatin ointment (MYCOSTATIN) Apply 1 application topically 2 (two) times daily as needed for rash. 02/27/20   [provider]  oxyCODONE (ROXICODONE) 15 MG immediate release tablet Take 15 mg by mouth in the morning and at bedtime.    [provider]  prazosin (MINIPRESS) 5 MG capsule Take 5 mg by mouth at bedtime.    [provider]  promethazine (PHENERGAN) 25 MG tablet Take 25 mg by mouth in the morning and at bedtime.    [provider]  RINVOQ 15 MG TB24 Take 15 mg by mouth daily. 02/23/20   [provider]  tiZANidine (ZANAFLEX) 4 MG tablet Take 1 tablet (4 mg total) by mouth every 6 (six) hours as needed for muscle spasms. Patient taking differently: Take 4 mg by mouth 3 (three) times daily as needed for muscle spasms.  03/31/16   Dene Gentry, MD  zolpidem (AMBIEN) 10 MG tablet  01/14/16 08/29/18  [provider]      Allergies    Hydrocodone, Gabapentin, Norvasc [amlodipine  besylate], Penicillins, Pramipexole, Pregabalin, and Ropinirole    Review of Systems   Review of Systems  HENT:  Positive for ear pain.     Physical Exam Updated Vital Signs BP (!) 172/103 (BP Location: Left Arm)   Pulse 72   Temp (!) 100.5 F (38.1 C) (Oral)   Resp 17   SpO2 98%  Physical Exam Vitals and nursing note reviewed.  Constitutional:      General: She is not in acute distress.    Appearance: She is well-developed. She is not diaphoretic.  HENT:     Head: Normocephalic and atraumatic.     Comments: Bilateral TMs with a purulent effusion and distortion of landmarks worse on the right than the left.  Swollen turbinates posterior nasal drip. Eyes:     Pupils: Pupils are equal,  round, and reactive to light.  Cardiovascular:     Rate and Rhythm: Normal rate and regular rhythm.     Heart sounds: No murmur heard.    No friction rub. No gallop.  Pulmonary:     Effort: Pulmonary effort is normal.     Breath sounds: No wheezing or rales.  Abdominal:     General: There is no distension.     Palpations: Abdomen is soft.     Tenderness: There is no abdominal tenderness.  Musculoskeletal:        General: No tenderness.     Cervical back: Normal range of motion and neck supple.  Skin:    General: Skin is warm and dry.  Neurological:     Mental Status: She is alert and oriented to person, place, and time.  Psychiatric:        Behavior: Behavior normal.     ED Results / Procedures / Treatments   Labs (all labs ordered are listed, but only abnormal results are displayed) Labs Reviewed - No data to display  EKG None  Radiology No results found.  Procedures Procedures    Medications Ordered in ED Medications  cefdinir (OMNICEF) capsule 300 mg (has no administration in time range)    ED Course/ Medical Decision Making/ A&P                           Medical Decision Making Risk Prescription drug management.   56 yo F with a chief complaint of an upper respiratory infection.  Well-appearing nontoxic.  Has bilateral otitis media.  She has a penicillin allergy but has reportedly tolerated cephalosporins in the past.  Will start on Omnicef.  PCP follow-up.  7:08 PM:  I have discussed the diagnosis/risks/treatment options with the patient.  Evaluation and diagnostic testing in the emergency department does not suggest an emergent condition requiring admission or immediate intervention beyond what has been performed at this time.  They will follow up with  PCP. We also discussed returning to the ED immediately if new or worsening sx occur. We discussed the sx which are most concerning (e.g., sudden worsening pain, fever, inability to tolerate by mouth) that  necessitate immediate return. Medications administered to the patient during their visit and any new prescriptions provided to the patient are listed below.  Medications given during this visit Medications  cefdinir (OMNICEF) capsule 300 mg (has no administration in time range)     The patient appears reasonably screen and/or stabilized for discharge and I doubt any other medical condition or other [facility] requiring further screening, evaluation, or treatment in the ED at this  time prior to discharge.          Final Clinical Impression(s) / ED Diagnoses Final diagnoses:  Acute suppurative otitis media of both ears without spontaneous rupture of tympanic membranes, recurrence not specified    Rx / DC Orders ED Discharge Orders          Ordered    cefdinir (OMNICEF) 300 MG capsule  2 times daily        08/05/22 1906              Deno Etienne, DO 08/05/22 1908

## 2022-08-05 NOTE — ED Triage Notes (Signed)
Patient presents to ED via POV from home. Here with right ear pain x 4 days. Reports sinus issues.

## 2022-08-05 NOTE — Discharge Instructions (Addendum)
Follow-up with your doctor in the office.  Take tylenol 2 pills 4 times a day and motrin 4 pills 3 times a day.  Drink plenty of fluids.  Return for worsening shortness of breath, headache, confusion. Follow up with your family doctor.

## 2022-08-06 ENCOUNTER — Emergency Department (HOSPITAL_BASED_OUTPATIENT_CLINIC_OR_DEPARTMENT_OTHER)
Admission: EM | Admit: 2022-08-06 | Discharge: 2022-08-06 | Disposition: A | Discharge status: Home / Self Care | Payer: Medicare HMO | Attending: Emergency Medicine | Admitting: Emergency Medicine

## 2022-08-06 ENCOUNTER — Encounter (HOSPITAL_BASED_OUTPATIENT_CLINIC_OR_DEPARTMENT_OTHER): Payer: Self-pay

## 2022-08-06 ENCOUNTER — Other Ambulatory Visit: Payer: Self-pay

## 2022-08-06 DIAGNOSIS — H5789 Other specified disorders of eye and adnexa: Secondary | ICD-10-CM

## 2022-08-06 NOTE — ED Provider Notes (Signed)
[facility] EMERGENCY DEPARTMENT Provider Note   CSN: [id number] Arrival date & time: 08/06/22  1903     History  Chief Complaint  Patient presents with   Eye Problem    Kelly Valenzuela is a 56 y.o. female.  56 yo F with a chief complaints of eye swelling.  She woke up this morning and felt both of her eyes were swollen.  She has been sick for about a week.  Was actually seen yesterday and diagnosed with bilateral otitis media.  Has been taking her antibiotics without issue.   Eye Problem      Home Medications Prior to Admission medications   Medication Sig Start Date End Date Taking? Authorizing Provider  acetaminophen (TYLENOL) 500 MG tablet Take 1,000 mg by mouth every 6 (six) hours as needed for moderate pain or headache.    [provider]  alprazolam Duanne Moron) 2 MG tablet Take 2 mg by mouth in the morning, at noon, in the evening, and at bedtime.    [provider]  benztropine (COGENTIN) 1 MG tablet Take 1 mg by mouth at bedtime.    [provider]  Candesartan Cilexetil-HCTZ 32-25 MG TABS Take 1 tablet by mouth daily. 04/03/18   [provider]  carvedilol (COREG) 6.25 MG tablet Take 6.25 mg by mouth in the morning and at bedtime.  01/18/16   [provider]  cefdinir (OMNICEF) 300 MG capsule Take 1 capsule (300 mg total) by mouth 2 (two) times daily for 10 days. 08/05/22 08/15/22  Deno Etienne, DO  cholecalciferol (VITAMIN D3) 25 MCG (1000 UNIT) tablet Take 1,000 Units by mouth 2 (two) times a week.    [provider]  DEXILANT 60 MG capsule Take 60 mg by mouth daily.  01/10/16   [provider]  famotidine (PEPCID) 40 MG tablet Take 40 mg by mouth at bedtime.    [provider]  hydrOXYzine (VISTARIL) 50 MG capsule Take 50 mg by mouth 4 (four) times daily. 02/22/20   [provider]  INVEGA SUSTENNA 156 MG/ML SUSY injection Inject 156 mg into the muscle every 30 (thirty) days. 02/20/20    [provider]  lamoTRIgine (LAMICTAL) 150 MG tablet Take 150 mg by mouth at bedtime.    [provider]  Lemborexant (DAYVIGO) 10 MG TABS Take 10 mg by mouth at bedtime.    [provider]  mupirocin cream (BACTROBAN) 2 % Apply 1 application topically 2 (two) times daily. 10/25/21   Smoot, Leary Roca, PA-C  nystatin ointment (MYCOSTATIN) Apply 1 application topically 2 (two) times daily as needed for rash. 02/27/20   [provider]  oxyCODONE (ROXICODONE) 15 MG immediate release tablet Take 15 mg by mouth in the morning and at bedtime.    [provider]  prazosin (MINIPRESS) 5 MG capsule Take 5 mg by mouth at bedtime.    [provider]  promethazine (PHENERGAN) 25 MG tablet Take 25 mg by mouth in the morning and at bedtime.    [provider]  RINVOQ 15 MG TB24 Take 15 mg by mouth daily. 02/23/20   [provider]  tiZANidine (ZANAFLEX) 4 MG tablet Take 1 tablet (4 mg total) by mouth every 6 (six) hours as needed for muscle spasms. Patient taking differently: Take 4 mg by mouth 3 (three) times daily as needed for muscle spasms.  03/31/16   Dene Gentry, MD  zolpidem (AMBIEN) 10 MG tablet  01/14/16 08/29/18  [provider]      Allergies    Hydrocodone, Gabapentin, Norvasc [amlodipine besylate], Penicillins, Pramipexole, Pregabalin, and Ropinirole    Review of Systems   Review of Systems  Physical Exam Updated Vital Signs BP (!) 178/100   Pulse 77   Temp 99.5 F (37.5 C) (Oral)   Resp 16   Ht '5\' 9"'$  (1.753 m)   Wt (!) 149 kg   SpO2 100%   BMI 48.51 kg/m  Physical Exam Vitals and nursing note reviewed.  Constitutional:      General: She is not in acute distress.    Appearance: She is well-developed. She is not diaphoretic.  HENT:     Head: Normocephalic and atraumatic.  Eyes:     General: Lids are normal.     Extraocular Movements:     Right eye: Normal extraocular motion.     Left eye: Normal  extraocular motion.     Conjunctiva/sclera:     Right eye: Right conjunctiva is not injected. No chemosis or exudate.    Left eye: Left conjunctiva is not injected. No chemosis or exudate.    Pupils: Pupils are equal, round, and reactive to light.  Cardiovascular:     Rate and Rhythm: Normal rate and regular rhythm.     Heart sounds: No murmur heard.    No friction rub. No gallop.  Pulmonary:     Effort: Pulmonary effort is normal.     Breath sounds: No wheezing or rales.  Abdominal:     General: There is no distension.     Palpations: Abdomen is soft.     Tenderness: There is no abdominal tenderness.  Musculoskeletal:        General: No tenderness.     Cervical back: Normal range of motion and neck supple.  Skin:    General: Skin is warm and dry.  Neurological:     Mental Status: She is alert and oriented to person, place, and time.  Psychiatric:        Behavior: Behavior normal.     ED Results / Procedures / Treatments   Labs (all labs ordered are listed, but only abnormal results are displayed) Labs Reviewed - No data to display  EKG None  Radiology No results found.  Procedures Procedures    Medications Ordered in ED Medications - No data to display  ED Course/ Medical Decision Making/ A&P                           Medical Decision Making  56 yo F with a chief complaint of bilateral eye swelling.  I saw the patient yesterday she was complaining of ear pain.  I see no obvious concerning finding to the eyes.  We will have her continue her antibiotics.  The ears.  10:59 PM:  I have discussed the diagnosis/risks/treatment options with the patient.  Evaluation and diagnostic testing in the emergency department does not suggest an emergent condition requiring admission or immediate intervention beyond what has been performed at this time.  They will follow up with  PCP. We also discussed returning to the ED immediately if new or worsening sx occur. We discussed the  sx which are most concerning (e.g., sudden worsening pain, fever, inability to tolerate by mouth) that necessitate immediate return. Medications administered to the patient during their visit and any new prescriptions provided to the patient are listed below.  Medications given during this visit Medications - No data  to display   The patient appears reasonably screen and/or stabilized for discharge and I doubt any other medical condition or other [facility] requiring further screening, evaluation, or treatment in the ED at this time prior to discharge.          Final Clinical Impression(s) / ED Diagnoses Final diagnoses:  Eye irritation    Rx / DC Orders ED Discharge Orders     None         Deno Etienne, DO 08/06/22 2259

## 2022-08-06 NOTE — ED Triage Notes (Signed)
Patient c/o bilateral eye swelling x today. No change in vision.

## 2022-08-06 NOTE — ED Notes (Signed)
Dr. Tyrone Nine made aware of pt's BP and states pt is okay for discharge. Pt reports has not had night time BP meds yet and will take them as soon as she gets home.

## 2023-04-05 ENCOUNTER — Encounter: Payer: Self-pay | Admitting: *Deleted

## 2023-09-20 ENCOUNTER — Encounter: Payer: Self-pay | Admitting: Internal Medicine

## 2023-09-20 ENCOUNTER — Ambulatory Visit: Payer: Medicare HMO | Admitting: Internal Medicine

## 2023-09-20 VITALS — BP 126/78 | HR 68 | Temp 98.0°F | Resp 20 | Ht 69.0 in | Wt 292.0 lb

## 2023-09-20 DIAGNOSIS — Z6841 Body Mass Index (BMI) 40.0 and over, adult: Secondary | ICD-10-CM

## 2023-09-20 DIAGNOSIS — H65113 Acute and subacute allergic otitis media (mucoid) (sanguinous) (serous), bilateral: Secondary | ICD-10-CM

## 2023-09-20 DIAGNOSIS — R42 Dizziness and giddiness: Secondary | ICD-10-CM | POA: Diagnosis not present

## 2023-09-20 MED ORDER — MECLIZINE HCL 12.5 MG PO TABS: 12.5000 mg | ORAL_TABLET | Freq: Three times a day (TID) | ORAL | 0 refills | Status: AC | PRN

## 2023-09-20 MED ORDER — FEXOFENADINE HCL 60 MG PO TABS: 60.0000 mg | ORAL_TABLET | Freq: Two times a day (BID) | ORAL | 0 refills | Status: AC

## 2023-09-20 MED ORDER — FLUTICASONE PROPIONATE 50 MCG/ACT NA SUSP: 1.0000 | Freq: Every day | NASAL | 0 refills | Status: AC

## 2023-09-20 NOTE — Assessment & Plan Note (Signed)
I will start her on meclizine 12.5 mg 3 times a day as needed.  I have discussed with her that high doses of Xanax can also will make her risk to fall because of balance issue.  She did not wanted to discuss that.

## 2023-09-20 NOTE — Progress Notes (Signed)
New Patient Office Visit  Subjective    Patient ID: Kelly Valenzuela, female    DOB: [birth year]  Age: 57 y.o. MRN: [id number]  CC:  Chief Complaint  Patient presents with   New Patient (Initial Visit)    Rt. Ear pain    HPI Kelly Valenzuela presents to establish care.  Complaining of pain in her right ear and left ear also feel funny.  She admits that she has stuffy nose.  She denies having any recurrent ear infection.  She denies having any wheezing, no sore throat, no postnasal drip.  She wanted to make sure that she does not have an ear infection. She denies any COPD. She is also complaining of vertigo where the room spins around her but only on looking at certain position.  She wants something to be given for her vertigo.   Outpatient Encounter Medications as of 09/20/2023  Medication Sig   acetaminophen (TYLENOL) 500 MG tablet Take 1,000 mg by mouth every 6 (six) hours as needed for moderate pain or headache.   alprazolam (XANAX) 2 MG tablet Take 2 mg by mouth in the morning, at noon, in the evening, and at bedtime.   benztropine (COGENTIN) 1 MG tablet Take 1 mg by mouth at bedtime.   Candesartan Cilexetil-HCTZ 32-25 MG TABS Take 1 tablet by mouth daily.   carvedilol (COREG) 6.25 MG tablet Take 6.25 mg by mouth in the morning and at bedtime.    cholecalciferol (VITAMIN D3) 25 MCG (1000 UNIT) tablet Take 1,000 Units by mouth 2 (two) times a week.   DEXILANT 60 MG capsule Take 60 mg by mouth daily.    famotidine (PEPCID) 40 MG tablet Take 40 mg by mouth at bedtime.   fexofenadine (ALLEGRA) 60 MG tablet Take 1 tablet (60 mg total) by mouth 2 (two) times daily.   fluticasone (FLONASE) 50 MCG/ACT nasal spray Place 1 spray into both nostrils daily.   hydrOXYzine (VISTARIL) 50 MG capsule Take 50 mg by mouth 4 (four) times daily.   INVEGA SUSTENNA 156 MG/ML SUSY injection Inject 156 mg into the muscle every 30 (thirty) days.   lamoTRIgine (LAMICTAL) 150 MG tablet Take 150 mg by  mouth at bedtime.   Lemborexant (DAYVIGO) 10 MG TABS Take 10 mg by mouth at bedtime.   meclizine (ANTIVERT) 12.5 MG tablet Take 1 tablet (12.5 mg total) by mouth 3 (three) times daily as needed for dizziness.   mupirocin cream (BACTROBAN) 2 % Apply 1 application topically 2 (two) times daily.   nystatin ointment (MYCOSTATIN) Apply 1 application topically 2 (two) times daily as needed for rash.   oxyCODONE (ROXICODONE) 15 MG immediate release tablet Take 15 mg by mouth in the morning and at bedtime.   prazosin (MINIPRESS) 5 MG capsule Take 5 mg by mouth at bedtime.   promethazine (PHENERGAN) 25 MG tablet Take 25 mg by mouth in the morning and at bedtime.   RINVOQ 15 MG TB24 Take 15 mg by mouth daily.   tiZANidine (ZANAFLEX) 4 MG tablet Take 1 tablet (4 mg total) by mouth every 6 (six) hours as needed for muscle spasms. (Patient taking differently: Take 4 mg by mouth 3 (three) times daily as needed for muscle spasms. )   zolpidem (AMBIEN) 10 MG tablet    [DISCONTINUED] zolpidem (AMBIEN) 10 MG tablet    No facility-administered encounter medications on file as of 09/20/2023.    Past Medical History:  Diagnosis Date   Anxiety    Hypertension  Rheumatoid arthritis (HCC)    Sebaceous hyperplasia of vulva     Past Surgical History:  Procedure Laterality Date   ABDOMINAL HYSTERECTOMY     BIOPSY  03/10/2020   Procedure: BIOPSY;  Surgeon: Kerin Salen, MD;  Location: [facility];  Service: Gastroenterology;;   COLON SURGERY     COLONOSCOPY WITH PROPOFOL N/A 03/10/2020   Procedure: COLONOSCOPY WITH PROPOFOL;  Surgeon: Kerin Salen, MD;  Location: [facility];  Service: Gastroenterology;  Laterality: N/A;   ESOPHAGOGASTRODUODENOSCOPY (EGD) WITH PROPOFOL N/A 03/10/2020   Procedure: ESOPHAGOGASTRODUODENOSCOPY (EGD) WITH PROPOFOL;  Surgeon: Kerin Salen, MD;  Location: [facility];  Service: Gastroenterology;  Laterality: N/A;   POLYPECTOMY  03/10/2020   Procedure: POLYPECTOMY;  Surgeon: Kerin Salen, MD;  Location: [facility];  Service: Gastroenterology;;   STOMACH SURGERY     VAGINA RECONSTRUCTION SURGERY      Family History  Problem Relation Age of Onset   Alcohol abuse Father    Depression Father    Depression Sister    Alcohol abuse Sister     Social History   Socioeconomic History   Marital status: Single    Spouse name: Not on file   Number of children: Not on file   Years of education: Not on file   Highest education level: High school graduate  Occupational History   Not on file  Tobacco Use   Smoking status: Never   Smokeless tobacco: Never  Vaping Use   Vaping status: Never Used  Substance and Sexual Activity   Alcohol use: No    Alcohol/week: 0.0 standard drinks of alcohol   Drug use: No   Sexual activity: Not on file  Other Topics Concern   Not on file  Social History Narrative   Not on file   Social Determinants of Health   Financial Resource Strain: Medium Risk (05/11/2023)   Received from [facility]   Overall Financial Resource Strain (CARDIA)    Difficulty of Paying Living Expenses: Somewhat hard  Food Insecurity: Low Risk  (07/23/2023)   Received from [facility]   Hunger Vital Sign    Worried About Running Out of Food in the Last Year: Never true    Ran Out of Food in the Last Year: Never true  Recent Concern: Food Insecurity - Food Insecurity Present (05/11/2023)   Received from [facility]   Hunger Vital Sign    Worried About Running Out of Food in the Last Year: Sometimes true    Ran Out of Food in the Last Year: Sometimes true  Transportation Needs: Not on file (07/23/2023)  Recent Concern: Transportation Needs - Unmet Transportation Needs (05/11/2023)   Received from [facility] - Transportation    Lack of Transportation (Medical): Yes    Lack of Transportation (Non-Medical): Yes  Physical Activity: Unknown (05/11/2023)   Received from [facility]   Exercise Vital Sign    Days of Exercise per Week: 0 days     Minutes of Exercise per Session: Not on file  Stress: Stress Concern Present (05/11/2023)   Received from [facility] - Occupational Stress Questionnaire    Feeling of Stress : Very much  Social Connections: Socially Isolated (05/11/2023)   Received from [facility]   Social Network    How would you rate your social network (family, work, friends)?: Little participation, lonely and socially isolated  Intimate Partner Violence: Not At Risk (05/11/2023)   Received from [facility]  HITS    Over the last 12 months how often did your partner physically hurt you?: 1    Over the last 12 months how often did your partner insult you or talk down to you?: 1    Over the last 12 months how often did your partner threaten you with physical harm?: 1    Over the last 12 months how often did your partner scream or curse at you?: 1    Review of Systems  Constitutional: Negative.   HENT:  Positive for congestion and ear pain.   Respiratory:  Negative for cough and shortness of breath.   Cardiovascular:  Negative for chest pain and palpitations.        Objective    BP 126/78 (BP Location: Left Arm, Patient Position: Sitting, Cuff Size: Large)   Pulse 68   Temp 98 F (36.7 C)   Resp 20   Ht 5\' 9"  (1.753 m)   Wt 292 lb (132.5 kg)   SpO2 97%   BMI 43.12 kg/m   Physical Exam Constitutional:      Appearance: Normal appearance.  HENT:     Head: Normocephalic and atraumatic.  Eyes:     Extraocular Movements: Extraocular movements intact.     Pupils: Pupils are equal, round, and reactive to light.  Cardiovascular:     Rate and Rhythm: Normal rate and regular rhythm.     Heart sounds: Normal heart sounds.  Pulmonary:     Effort: Pulmonary effort is normal.     Breath sounds: Normal breath sounds.  Abdominal:     General: Bowel sounds are normal.     Palpations: Abdomen is soft.  Neurological:     Mental Status: She is alert.          Assessment & Plan:   Problem List Items Addressed This Visit       Nervous and Auditory   Acute allergic otitis media of both ears - Primary    Mild fluid in the right lung but good light reflex in both lungs.  She will use anti-inflammatory medicine and antihistamine and nasal spray.        Other   Vertigo    I will start her on meclizine 12.5 mg 3 times a day as needed.  I have discussed with her that high doses of Xanax can also will make her risk to fall because of balance issue.  She did not wanted to discuss that.     She will come back if she is not feeling better.  No follow-ups on file.   Eloisa Northern, MD

## 2023-09-20 NOTE — Assessment & Plan Note (Signed)
Mild fluid in the right lung but good light reflex in both lungs.  She will use anti-inflammatory medicine and antihistamine and nasal spray.
